# Patient Record
Sex: Female | Born: 1965 | Race: White | Hispanic: No | Marital: Single | Smoking: Never smoker
Health system: Southern US, Community
[De-identification: ages and names within clinical notes are randomized; demographics above are authoritative.]

## PROBLEM LIST (undated history)

## (undated) DIAGNOSIS — E039 Hypothyroidism, unspecified: Secondary | ICD-10-CM

## (undated) DIAGNOSIS — C50919 Malignant neoplasm of unspecified site of unspecified female breast: Secondary | ICD-10-CM

## (undated) HISTORY — PX: BREAST LUMPECTOMY: SHX2

## (undated) HISTORY — DX: Hypothyroidism, unspecified: E03.9

## (undated) HISTORY — DX: Malignant neoplasm of unspecified site of unspecified female breast: C50.919

---

## 2019-03-15 LAB — TSH: TSH: 2.94 (ref 0.41–5.90)

## 2019-08-15 ENCOUNTER — Ambulatory Visit: Payer: Federal, State, Local not specified - PPO | Admitting: "Endocrinology

## 2019-08-15 ENCOUNTER — Other Ambulatory Visit: Payer: Self-pay

## 2019-08-15 ENCOUNTER — Encounter: Payer: Self-pay | Admitting: "Endocrinology

## 2019-08-15 VITALS — BP 138/74 | HR 94 | Ht 63.0 in | Wt 271.0 lb

## 2019-08-15 DIAGNOSIS — E039 Hypothyroidism, unspecified: Secondary | ICD-10-CM

## 2019-08-15 NOTE — Progress Notes (Signed)
Endocrinology Consult Note                                         08/15/2019, 5:53 PM   Yvonne Frey is a 53 y.o.-year-old female patient being seen in consultation for hypothyroidism referred by Cathie Olden, MD.   Past Medical History:  Diagnosis Date  . Hypothyroidism     History reviewed. No pertinent surgical history.  Social History   Socioeconomic History  . Marital status: Single    Spouse name: Not on file  . Number of children: Not on file  . Years of education: Not on file  . Highest education level: Not on file  Occupational History  . Not on file  Social Needs  . Financial resource strain: Not on file  . Food insecurity    Worry: Not on file    Inability: Not on file  . Transportation needs    Medical: Not on file    Non-medical: Not on file  Tobacco Use  . Smoking status: Never Smoker  . Smokeless tobacco: Never Used  Substance and Sexual Activity  . Alcohol use: Never    Frequency: Never  . Drug use: Never  . Sexual activity: Not on file  Lifestyle  . Physical activity    Days per week: Not on file    Minutes per session: Not on file  . Stress: Not on file  Relationships  . Social Herbalist on phone: Not on file    Gets together: Not on file    Attends religious service: Not on file    Active member of club or organization: Not on file    Attends meetings of clubs or organizations: Not on file    Relationship status: Not on file  Other Topics Concern  . Not on file  Social History Narrative  . Not on file    History reviewed. No pertinent family history.  Outpatient Encounter Medications as of 08/15/2019  Medication Sig  . etanercept (ENBREL) 50 MG/ML injection Inject into the muscle once a week.  . levothyroxine (SYNTHROID) 25 MCG tablet Take by mouth daily.  Marland Kitchen lisinopril-hydrochlorothiazide (ZESTORETIC) 10-12.5 MG tablet Take by  mouth daily.  Marland Kitchen levothyroxine (SYNTHROID) 25 MCG tablet daily.  Marland Kitchen omeprazole (PRILOSEC) 40 MG capsule daily.  Marland Kitchen PREMARIN 0.45 MG tablet daily.   No facility-administered encounter medications on file as of 08/15/2019.     ALLERGIES: No Known Allergies VACCINATION STATUS:  There is no immunization history on file for this patient.   HPI    Yvonne Frey  is a patient with the above medical history. she was diagnosed  with hypothyroidism at approximate age of 76 years with work, which required initiation with levothyroxine replacement.  She is currently on her initial levothyroxine dose of 25 mcg p.o. daily before breakfast.  -She reports compliance with his medication.  She complains of progressive weight gain, fatigue, cold intolerance. She  does not have recent thyroid function tests, TSH was 2.94, with free t4   0.82 on Mar 15, 2019.  -She denies personal history of goiter, denies dysphagia, shortness of breath, but reports occasional voice change.     she reports family history of  thyroid disorders in her mother who is taking what appears to be thyroid hormone replacement.  No family history of thyroid cancer.  No history of  radiation therapy to head or neck. No recent use of iodine supplements.   ROS:  Constitutional: + weight gain, + fatigue, no subjective hyperthermia, no subjective hypothermia Eyes: no blurry vision, no xerophthalmia ENT: no sore throat, no nodules palpated in throat, no dysphagia/odynophagia, no hoarseness Cardiovascular: no Chest Pain, no Shortness of Breath, no palpitations, no leg swelling Respiratory: no cough, no SOB Gastrointestinal: no Nausea/Vomiting/Diarhhea Musculoskeletal: no muscle/joint aches Skin: no rashes Neurological: no tremors, no numbness, no tingling, no dizziness Psychiatric: no depression, no anxiety   Physical Exam: BP 138/74   Pulse 94   Ht 5\' 3"  (1.6 m)   Wt 271 lb (122.9 kg)   BMI 48.01 kg/m  Wt Readings from  Last 3 Encounters:  08/15/19 271 lb (122.9 kg)    Constitutional:  Body mass index is 48.01 kg/m., not in acute distress, normal state of mind Eyes: PERRLA, EOMI, no exophthalmos ENT: moist mucous membranes, + palpable thyromegaly, no cervical lymphadenopathy Cardiovascular: normal precordial activity, Regular Rate and Rhythm, no Murmur/Rubs/Gallops Respiratory:  adequate breathing efforts, no gross chest deformity, Clear to auscultation bilaterally Gastrointestinal: abdomen soft, Non -tender, No distension, Bowel Sounds present Musculoskeletal: no gross deformities, strength intact in all four extremities Skin: moist, warm, no rashes Neurological: no tremor with outstretched hands, Deep tendon reflexes normal in all four extremities.  Mar 15, 2019 thyroid function test: TSH 2.94, free T4 0.82  ASSESSMENT: 1. Hypothyroidism  PLAN:    Patient with long-standing hypothyroidism, on levothyroxine therapy. On physical exam , patient  does   have  gross goiter.  She is off her baseline thyroid ultrasound.  She may need higher dose of thyroid hormone.  She will be sent for new set of thyroid function test today. -In the meantime, he is advised to continue her current dose of levothyroxine at 25 mcg daily before breakfast. - We discussed about correct intake of levothyroxine, at fasting, with water, separated by at least 30 minutes from breakfast, and separated by more than 4 hours from calcium, iron, multivitamins, acid reflux medications (PPIs). -Patient is made aware of the fact that thyroid hormone replacement is needed for life, dose to be adjusted by periodic monitoring of thyroid function tests. - Time spent with the patient: 45 minutes, of which >50% was spent in obtaining information about her symptoms, reviewing her previous labs, evaluations, and treatments, counseling her about her   hypothyroidism, and developing a plan to confirm the diagnosis and long term treatment as necessary.  Please refer to " Patient Self Inventory" in the Media  tab for reviewed elements of pertinent patient history.  Mertie Clause participated in the discussions, expressed understanding, and voiced agreement with the above plans.  All questions were answered to her satisfaction. she is encouraged to contact clinic should she have any questions or concerns prior to her return visit.  Return in about 1 week (around 08/22/2019) for Labs Today- Non-Fasting Ok, Thyroid / Neck Ultrasound.  Glade Lloyd, MD [facility] [address] [place], [place] [address] Phone: [phone]  Fax: [phone]   08/15/2019, 5:53 PM  This note was partially dictated with voice recognition software. Similar sounding words can be transcribed inadequately or may not  be corrected upon review.

## 2019-08-16 ENCOUNTER — Other Ambulatory Visit: Payer: Self-pay

## 2019-08-16 ENCOUNTER — Ambulatory Visit (HOSPITAL_COMMUNITY)
Admission: RE | Admit: 2019-08-16 | Discharge: 2019-08-16 | Disposition: A | Discharge status: Home / Self Care | Payer: Federal, State, Local not specified - PPO | Source: Ambulatory Visit | Attending: "Endocrinology | Admitting: "Endocrinology

## 2019-08-16 DIAGNOSIS — E039 Hypothyroidism, unspecified: Secondary | ICD-10-CM | POA: Insufficient documentation

## 2019-08-16 LAB — T3, FREE: T3, Free: 3 pg/mL (ref 2.3–4.2)

## 2019-08-16 LAB — THYROGLOBULIN ANTIBODY: Thyroglobulin Ab: 4 IU/mL — ABNORMAL HIGH (ref <=1)

## 2019-08-16 LAB — THYROID PEROXIDASE ANTIBODY: Thyroperoxidase Ab SerPl-aCnc: 163 IU/mL — ABNORMAL HIGH (ref <9)

## 2019-08-16 LAB — T4, FREE: Free T4: 1 ng/dL (ref 0.8–1.8)

## 2019-08-16 LAB — TSH: TSH: 2.06 mIU/L

## 2019-08-22 ENCOUNTER — Encounter: Payer: Self-pay | Admitting: "Endocrinology

## 2019-08-22 ENCOUNTER — Ambulatory Visit (INDEPENDENT_AMBULATORY_CARE_PROVIDER_SITE_OTHER): Payer: Federal, State, Local not specified - PPO | Admitting: "Endocrinology

## 2019-08-22 ENCOUNTER — Other Ambulatory Visit: Payer: Self-pay

## 2019-08-22 DIAGNOSIS — E038 Other specified hypothyroidism: Secondary | ICD-10-CM | POA: Diagnosis not present

## 2019-08-22 DIAGNOSIS — E063 Autoimmune thyroiditis: Secondary | ICD-10-CM

## 2019-08-22 DIAGNOSIS — E049 Nontoxic goiter, unspecified: Secondary | ICD-10-CM

## 2019-08-22 DIAGNOSIS — E040 Nontoxic diffuse goiter: Secondary | ICD-10-CM

## 2019-08-22 MED ORDER — LEVOTHYROXINE SODIUM 50 MCG PO TABS: 50.0000 ug | ORAL_TABLET | Freq: Every day | ORAL | 3 refills | Status: DC

## 2019-08-22 NOTE — Progress Notes (Signed)
08/22/2019, 3:07 PM                                Endocrinology Telehealth Visit Follow up Note -During COVID -19 Pandemic  I connected with Yvonne Frey on 08/22/2019   by telephone and verified that I am speaking with the correct person using two identifiers. Yvonne Frey, 1966-03-14. she has verbally consented to this visit. All issues noted in this document were discussed and addressed. The format was not optimal for physical exam.   Yvonne Frey is a 53 y.o.-year-old female patient being engaged in telehealth via telephone in follow-up after she was seen in consultation for hypothyroidism referred by Cathie Olden, MD.   Past Medical History:  Diagnosis Date  . Hypothyroidism     History reviewed. No pertinent surgical history.  Social History   Socioeconomic History  . Marital status: Single    Spouse name: Not on file  . Number of children: Not on file  . Years of education: Not on file  . Highest education level: Not on file  Occupational History  . Not on file  Social Needs  . Financial resource strain: Not on file  . Food insecurity    Worry: Not on file    Inability: Not on file  . Transportation needs    Medical: Not on file    Non-medical: Not on file  Tobacco Use  . Smoking status: Never Smoker  . Smokeless tobacco: Never Used  Substance and Sexual Activity  . Alcohol use: Never    Frequency: Never  . Drug use: Never  . Sexual activity: Not on file  Lifestyle  . Physical activity    Days per week: Not on file    Minutes per session: Not on file  . Stress: Not on file  Relationships  . Social Herbalist on phone: Not on file    Gets together: Not on file    Attends religious service: Not on file    Active member of club or organization: Not on file    Attends meetings of clubs or organizations: Not on file    Relationship status: Not on file  Other Topics Concern  . Not on file   Social History Narrative  . Not on file    History reviewed. No pertinent family history.  Outpatient Encounter Medications as of 08/22/2019  Medication Sig  . etanercept (ENBREL) 50 MG/ML injection Inject into the muscle once a week.  . levothyroxine (SYNTHROID) 50 MCG tablet Take 1 tablet (50 mcg total) by mouth daily before breakfast.  . lisinopril-hydrochlorothiazide (ZESTORETIC) 10-12.5 MG tablet Take by mouth daily.  Marland Kitchen omeprazole (PRILOSEC) 40 MG capsule daily.  Marland Kitchen PREMARIN 0.45 MG tablet daily.  . [DISCONTINUED] levothyroxine (SYNTHROID) 25 MCG tablet daily.  . [DISCONTINUED] levothyroxine (SYNTHROID) 25 MCG tablet Take by mouth daily.   No facility-administered encounter medications on file as of 08/22/2019.     ALLERGIES: No Known Allergies VACCINATION STATUS:  There is no immunization history on file for this patient.   HPI    Yvonne Frey  is a patient with the above medical history. she was diagnosed  with hypothyroidism at approximate age of  55 years with work, which required initiation with levothyroxine replacement.  She is currently on her initial levothyroxine dose of 25 mcg p.o. daily before breakfast.  -She reports compliance with medication.  Her previsit labs show evidence of under replacement. She complains of progressive weight gain, fatigue, cold intolerance.  -She denies personal history of goiter, denies dysphagia, shortness of breath, but reports occasional voice change.     she reports family history of  thyroid disorders in her mother who is taking what appears to be thyroid hormone replacement.  No family history of thyroid cancer.  No history of  radiation therapy to head or neck. No recent use of iodine supplements.   ROS: Limited as above.   Physical Exam: There were no vitals taken for this visit. Wt Readings from Last 3 Encounters:  08/15/19 271 lb (122.9 kg)   Recent Results (from the past 2160 hour(s))  TSH     Status: None    Collection Time: 08/15/19 12:18 PM  Result Value Ref Range   TSH 2.06 mIU/L    Comment:           Reference Range .           > or = 20 Years  0.40-4.50 .                Pregnancy Ranges           First trimester    0.26-2.66           Second trimester   0.55-2.73           Third trimester    0.43-2.91   T4, free     Status: None   Collection Time: 08/15/19 12:18 PM  Result Value Ref Range   Free T4 1.0 0.8 - 1.8 ng/dL  T3, free     Status: None   Collection Time: 08/15/19 12:18 PM  Result Value Ref Range   T3, Free 3.0 2.3 - 4.2 pg/mL  Thyroid peroxidase antibody     Status: Abnormal   Collection Time: 08/15/19 12:18 PM  Result Value Ref Range   Thyroperoxidase Ab SerPl-aCnc 163 (H) <9 IU/mL  Thyroglobulin antibody     Status: Abnormal   Collection Time: 08/15/19 12:18 PM  Result Value Ref Range   Thyroglobulin Ab 4 (H) < or = 1 IU/mL    Thyroid ultrasound from August 16, 2019 No discrete nodules are seen within the thyroid gland.  IMPRESSION: Borderline enlarged and moderately heterogeneous appearing thyroid gland without discrete nodule or mass.   Mar 15, 2019 thyroid function test: TSH 2.94, free T4 0.82  ASSESSMENT: 1. 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  Simple goiter  PLAN:   -Her previsit thyroid function tests are consistent with under replacement.  She would benefit from higher dose of levothyroxine.  I discussed and increase her levothyroxine to 50 mcg p.o. daily before breakfast.   - We discussed about the correct intake of her thyroid hormone, on empty stomach at fasting, with water, separated by at least 30 minutes from breakfast and other medications,  and separated by more than 4 hours from calcium, iron, multivitamins, acid reflux medications (PPIs). -Patient is made aware of the fact that thyroid hormone replacement is needed for life, dose to be adjusted by periodic monitoring of thyroid function tests. -Her thyroid ultrasound is  unremarkable, will not need any intervention.  She may need surveillance thyroid ultrasound in 2 to 3 years.   Time for this visit:  15 minutes. Yvonne Frey  participated in the discussions, expressed understanding, and voiced agreement with the above plans.  All questions were answered to her satisfaction. she is encouraged to contact clinic should she have any questions or concerns prior to her return visit.   Return in about 3 months (around 11/22/2019) for Follow up with Pre-visit Labs.  Glade Lloyd, MD [facility] [address] [place], [place] [address] Phone: [phone]  Fax: [phone]   08/22/2019, 3:07 PM  This note was partially dictated with voice recognition software. Similar sounding words can be transcribed inadequately or may not  be corrected upon review.

## 2019-11-21 ENCOUNTER — Other Ambulatory Visit: Payer: Self-pay | Admitting: "Endocrinology

## 2019-11-23 ENCOUNTER — Ambulatory Visit: Payer: Federal, State, Local not specified - PPO | Admitting: "Endocrinology

## 2019-12-22 LAB — TSH: TSH: 0.85 mIU/L

## 2019-12-22 LAB — T4, FREE: Free T4: 1.2 ng/dL (ref 0.8–1.8)

## 2019-12-26 ENCOUNTER — Encounter: Payer: Self-pay | Admitting: "Endocrinology

## 2019-12-26 ENCOUNTER — Ambulatory Visit (INDEPENDENT_AMBULATORY_CARE_PROVIDER_SITE_OTHER): Payer: Federal, State, Local not specified - PPO | Admitting: "Endocrinology

## 2019-12-26 DIAGNOSIS — E038 Other specified hypothyroidism: Secondary | ICD-10-CM

## 2019-12-26 DIAGNOSIS — E063 Autoimmune thyroiditis: Secondary | ICD-10-CM | POA: Diagnosis not present

## 2019-12-26 MED ORDER — LEVOTHYROXINE SODIUM 50 MCG PO TABS: ORAL_TABLET | ORAL | 1 refills | Status: DC

## 2019-12-26 NOTE — Progress Notes (Signed)
12/26/2019, 5:20 PM                                Endocrinology Telehealth Visit Follow up Note -During COVID -19 Pandemic  I connected with Yvonne Frey on 12/26/2019   by telephone and verified that I am speaking with the correct person using two identifiers. Yvonne Frey, Dec 22, 1965. she has verbally consented to this visit. All issues noted in this document were discussed and addressed. The format was not optimal for physical exam.   Yvonne Frey is a 54 y.o.-year-old female patient being engaged in telehealth via telephone in follow-up after she was seen in consultation for hypothyroidism referred by Cathie Olden, MD.   Past Medical History:  Diagnosis Date  . Hypothyroidism     History reviewed. No pertinent surgical history.  Social History   Socioeconomic History  . Marital status: Single    Spouse name: Not on file  . Number of children: Not on file  . Years of education: Not on file  . Highest education level: Not on file  Occupational History  . Not on file  Tobacco Use  . Smoking status: Never Smoker  . Smokeless tobacco: Never Used  Substance and Sexual Activity  . Alcohol use: Never  . Drug use: Never  . Sexual activity: Not on file  Other Topics Concern  . Not on file  Social History Narrative  . Not on file   Social Determinants of Health   Financial Resource Strain:   . Difficulty of Paying Living Expenses: Not on file  Food Insecurity:   . Worried About Charity fundraiser in the Last Year: Not on file  . Ran Out of Food in the Last Year: Not on file  Transportation Needs:   . Lack of Transportation (Medical): Not on file  . Lack of Transportation (Non-Medical): Not on file  Physical Activity:   . Days of Exercise per Week: Not on file  . Minutes of Exercise per Session: Not on file  Stress:   . Feeling of Stress : Not on file  Social Connections:   . Frequency of Communication with  Friends and Family: Not on file  . Frequency of Social Gatherings with Friends and Family: Not on file  . Attends Religious Services: Not on file  . Active Member of Clubs or Organizations: Not on file  . Attends Archivist Meetings: Not on file  . Marital Status: Not on file    History reviewed. No pertinent family history.  Outpatient Encounter Medications as of 12/26/2019  Medication Sig  . etanercept (ENBREL) 50 MG/ML injection Inject into the muscle once a week.  . levothyroxine (SYNTHROID) 50 MCG tablet TAKE 1 TABLET BY MOUTH DAILY BEFORE BREAKFAST  . lisinopril-hydrochlorothiazide (ZESTORETIC) 10-12.5 MG tablet Take by mouth daily.  Marland Kitchen omeprazole (PRILOSEC) 40 MG capsule daily.  Marland Kitchen PREMARIN 0.45 MG tablet daily.  . [DISCONTINUED] levothyroxine (SYNTHROID) 50 MCG tablet TAKE 1 TABLET BY MOUTH DAILY BEFORE BREAKFAST   No facility-administered encounter medications on file as of 12/26/2019.    ALLERGIES: No Known Allergies VACCINATION STATUS:  There is no immunization history on file for this patient.   HPI  Yvonne Frey  is a patient with the above medical history. she was diagnosed  with hypothyroidism at approximate age of 59 years with work, which required initiation with levothyroxine replacement.  She is currently on her levothyroxine 50 mcg p.o. daily before breakfast.  She reports compliance to her medication.   -She reports compliance with medication.  Her previsit labs are consistent with appropriate replacement.  Has no new complaints today.    -She denies personal history of goiter, denies dysphagia, shortness of breath, but reports occasional voice change.     she reports family history of  thyroid disorders in her mother who is taking what appears to be thyroid hormone replacement.  No family history of thyroid cancer.  No history of  radiation therapy to head or neck. No recent use of iodine supplements.   ROS: Limited as above.   Physical  Exam: There were no vitals taken for this visit. Wt Readings from Last 3 Encounters:  08/15/19 271 lb (122.9 kg)   Recent Results (from the past 2160 hour(s))  TSH     Status: None   Collection Time: 12/21/19  1:17 PM  Result Value Ref Range   TSH 0.85 mIU/L    Comment:           Reference Range .           > or = 20 Years  0.40-4.50 .                Pregnancy Ranges           First trimester    0.26-2.66           Second trimester   0.55-2.73           Third trimester    0.43-2.91   T4, free     Status: None   Collection Time: 12/21/19  1:17 PM  Result Value Ref Range   Free T4 1.2 0.8 - 1.8 ng/dL    Thyroid ultrasound from August 16, 2019 No discrete nodules are seen within the thyroid gland.  IMPRESSION: Borderline enlarged and moderately heterogeneous appearing thyroid gland without discrete nodule or mass.   Mar 15, 2019 thyroid function test: TSH 2.94, free T4 0.82  ASSESSMENT: 1. 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  Simple goiter  PLAN:   -Her previsit thyroid function tests are consistent with appropriate replacement.  She is advised to continue  levothyroxine  50 mcg p.o. daily before breakfast.   - We discussed about the correct intake of her thyroid hormone, on empty stomach at fasting, with water, separated by at least 30 minutes from breakfast and other medications,  and separated by more than 4 hours from calcium, iron, multivitamins, acid reflux medications (PPIs). -Patient is made aware of the fact that thyroid hormone replacement is needed for life, dose to be adjusted by periodic monitoring of thyroid function tests.  -Her thyroid ultrasound is unremarkable, will not need any intervention.  She may need surveillance thyroid ultrasound in 2 to 3 years.      - Time spent on this patient care encounter:  20 minutes of which 50% was spent in  counseling and the rest reviewing  her current and  previous labs / studies and medications   doses and developing a plan for long term care. Yvonne Frey  participated in the discussions, expressed understanding, and voiced agreement with the above plans.  All questions were answered to her satisfaction. she is encouraged to contact clinic  should she have any questions or concerns prior to her return visit.    Return in about 6 months (around 06/27/2020) for Follow up with Pre-visit Labs.  Glade Lloyd, MD [facility] [address] [place], [place] [address] Phone: [phone]  Fax: [phone]   12/26/2019, 5:20 PM  This note was partially dictated with voice recognition software. Similar sounding words can be transcribed inadequately or may not  be corrected upon review.

## 2020-05-10 LAB — TSH: TSH: 1.36 (ref 0.41–5.90)

## 2020-06-28 ENCOUNTER — Ambulatory Visit: Payer: Federal, State, Local not specified - PPO | Admitting: "Endocrinology

## 2020-07-09 ENCOUNTER — Other Ambulatory Visit: Payer: Self-pay

## 2020-07-09 DIAGNOSIS — E038 Other specified hypothyroidism: Secondary | ICD-10-CM

## 2020-07-09 DIAGNOSIS — E063 Autoimmune thyroiditis: Secondary | ICD-10-CM

## 2020-07-09 LAB — T4, FREE: Free T4: 1 ng/dL (ref 0.8–1.8)

## 2020-07-09 LAB — TSH: TSH: 2.84 mIU/L

## 2020-07-10 ENCOUNTER — Telehealth (INDEPENDENT_AMBULATORY_CARE_PROVIDER_SITE_OTHER): Payer: Federal, State, Local not specified - PPO | Admitting: "Endocrinology

## 2020-07-10 ENCOUNTER — Encounter: Payer: Self-pay | Admitting: "Endocrinology

## 2020-07-10 VITALS — BP 127/74 | Ht 63.0 in | Wt 263.0 lb

## 2020-07-10 DIAGNOSIS — E038 Other specified hypothyroidism: Secondary | ICD-10-CM | POA: Diagnosis not present

## 2020-07-10 DIAGNOSIS — E063 Autoimmune thyroiditis: Secondary | ICD-10-CM | POA: Diagnosis not present

## 2020-07-10 MED ORDER — LEVOTHYROXINE SODIUM 50 MCG PO TABS
ORAL_TABLET | ORAL | 1 refills | Status: DC
Start: 2020-07-10 — End: 2020-08-27

## 2020-07-10 NOTE — Progress Notes (Signed)
07/10/2020, 5:17 PM                                        Endocrinology Telehealth Visit Follow up Note -During COVID -19 Pandemic  This visit type was conducted  via telephonedue to national recommendations for restrictions regarding the COVID-19 Pandemic  in an effort to limit this patient's exposure and mitigate transmission of the corona virus.   I connected with Yvonne Frey on 07/10/2020   by telephone and verified that I am speaking with the correct person using two identifiers. Yvonne Frey, 1966-08-11. she has verbally consented to this visit.  I was in my office and patient was in her residence. No other persons were with me during the encounter. All issues noted in this document were discussed and addressed. The format was not optimal for physical exam.   Yvonne Frey is a 54 y.o.-year-old female patient being engaged in telehealth via telephone in follow-up after she was seen in consultation for hypothyroidism referred by Cathie Olden, MD.   Past Medical History:  Diagnosis Date  . Hypothyroidism     History reviewed. No pertinent surgical history.  Social History   Socioeconomic History  . Marital status: Single    Spouse name: Not on file  . Number of children: Not on file  . Years of education: Not on file  . Highest education level: Not on file  Occupational History  . Not on file  Tobacco Use  . Smoking status: Never Smoker  . Smokeless tobacco: Never Used  Substance and Sexual Activity  . Alcohol use: Never  . Drug use: Never  . Sexual activity: Not on file  Other Topics Concern  . Not on file  Social History Narrative  . Not on file   Social Determinants of Health   Financial Resource Strain:   . Difficulty of Paying Living Expenses: Not on file  Food Insecurity:   . Worried About Charity fundraiser in the Last Year: Not on file  . Ran Out of Food in the Last Year: Not on file   Transportation Needs:   . Lack of Transportation (Medical): Not on file  . Lack of Transportation (Non-Medical): Not on file  Physical Activity:   . Days of Exercise per Week: Not on file  . Minutes of Exercise per Session: Not on file  Stress:   . Feeling of Stress : Not on file  Social Connections:   . Frequency of Communication with Friends and Family: Not on file  . Frequency of Social Gatherings with Friends and Family: Not on file  . Attends Religious Services: Not on file  . Active Member of Clubs or Organizations: Not on file  . Attends Archivist Meetings: Not on file  . Marital Status: Not on file    History reviewed. No pertinent family history.  Outpatient Encounter Medications as of 07/10/2020  Medication Sig  . levothyroxine (SYNTHROID) 50 MCG tablet TAKE 1 TABLET BY MOUTH DAILY BEFORE BREAKFAST  . lisinopril-hydrochlorothiazide (ZESTORETIC) 10-12.5 MG tablet Take by mouth daily.  Marland Kitchen omeprazole (PRILOSEC) 40 MG capsule daily.  Marland Kitchen PREMARIN 0.45 MG tablet daily.  Marland Kitchen  ustekinumab (STELARA) 90 MG/ML SOSY injection Inject into the skin.  . [DISCONTINUED] etanercept (ENBREL) 50 MG/ML injection Inject into the muscle once a week.  . [DISCONTINUED] levothyroxine (SYNTHROID) 50 MCG tablet TAKE 1 TABLET BY MOUTH DAILY BEFORE BREAKFAST   No facility-administered encounter medications on file as of 07/10/2020.    ALLERGIES: No Known Allergies VACCINATION STATUS:  There is no immunization history on file for this patient.   HPI    Yvonne Frey  is a patient with the above medical history. she was diagnosed  with hypothyroidism at approximate age of 62 years with work, which required initiation with levothyroxine replacement.  She is currently on levothyroxine 50 mcg p.o. daily before breakfast.  She reports compliance to her medication.  She has no new complaints today.    -She denies personal history of goiter, denies dysphagia, shortness of breath, but reports  occasional voice change.     she reports family history of  thyroid disorders in her mother who is taking what appears to be thyroid hormone replacement.  No family history of thyroid cancer.  No history of  radiation therapy to head or neck. No recent use of iodine supplements.   ROS: Limited as above.   Physical Exam: BP 127/74   Ht 5\' 3"  (1.6 m)   Wt 263 lb (119.3 kg)   BMI 46.59 kg/m  Wt Readings from Last 3 Encounters:  07/10/20 263 lb (119.3 kg)  08/15/19 271 lb (122.9 kg)   Recent Results (from the past 2160 hour(s))  TSH     Status: None   Collection Time: 05/10/20 12:00 AM  Result Value Ref Range   TSH 1.36 0.41 - 5.90    Comment: Free T4- 0.80  TSH     Status: None   Collection Time: 07/09/20 12:00 AM  Result Value Ref Range   TSH 2.84 mIU/L    Comment:           Reference Range .           > or = 20 Years  0.40-4.50 .                Pregnancy Ranges           First trimester    0.26-2.66           Second trimester   0.55-2.73           Third trimester    0.43-2.91   T4, Free     Status: None   Collection Time: 07/09/20 12:00 AM  Result Value Ref Range   Free T4 1.0 0.8 - 1.8 ng/dL    Thyroid ultrasound from August 16, 2019 No discrete nodules are seen within the thyroid gland.  IMPRESSION: Borderline enlarged and moderately heterogeneous appearing thyroid gland without discrete nodule or mass.   Mar 15, 2019 thyroid function test: TSH 2.94, free T4 0.82  ASSESSMENT: 1. 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  Simple goiter  PLAN:   -Her previsit thyroid function tests are consistent with appropriate replacement.  She is advised to continue levothyroxine 50 mcg p.o. daily before breakfast.    - We discussed about the correct intake of her thyroid hormone, on empty stomach at fasting, with water, separated by at least 30 minutes from breakfast and other medications,  and separated by more than 4 hours from calcium, iron,  multivitamins, acid reflux medications (PPIs). -Patient is made aware of the fact that thyroid hormone replacement is needed for life,  dose to be adjusted by periodic monitoring of thyroid function tests.  -Her thyroid ultrasound is unremarkable, will not need any intervention.  She may need surveillance thyroid ultrasound in 2 .  She is advised to maintain close follow-up with her PMD.     - Time spent on this patient care encounter:  20 minutes of which 50% was spent in  counseling and the rest reviewing  her current and  previous labs / studies and medications  doses and developing a plan for long term care. Yvonne Frey  participated in the discussions, expressed understanding, and voiced agreement with the above plans.  All questions were answered to her satisfaction. she is encouraged to contact clinic should she have any questions or concerns prior to her return visit.   Return in about 6 months (around 01/07/2021) for F/U with Pre-visit Labs.  Glade Lloyd, MD [facility] [address] [place], [place] [address] Phone: [phone]  Fax: [phone]   07/10/2020, 5:17 PM  This note was partially dictated with voice recognition software. Similar sounding words can be transcribed inadequately or may not  be corrected upon review.

## 2020-08-26 ENCOUNTER — Other Ambulatory Visit: Payer: Self-pay | Admitting: "Endocrinology

## 2021-01-09 ENCOUNTER — Ambulatory Visit: Payer: Federal, State, Local not specified - PPO | Admitting: "Endocrinology

## 2021-02-05 LAB — TSH: TSH: 1.25 u[IU]/mL (ref 0.450–4.500)

## 2021-02-05 LAB — T4, FREE: Free T4: 0.96 ng/dL (ref 0.82–1.77)

## 2021-02-07 ENCOUNTER — Ambulatory Visit: Payer: Federal, State, Local not specified - PPO | Admitting: "Endocrinology

## 2021-02-07 ENCOUNTER — Encounter: Payer: Self-pay | Admitting: "Endocrinology

## 2021-02-07 ENCOUNTER — Other Ambulatory Visit: Payer: Self-pay

## 2021-02-07 VITALS — BP 122/62 | HR 68 | Ht 63.0 in | Wt 271.4 lb

## 2021-02-07 DIAGNOSIS — E063 Autoimmune thyroiditis: Secondary | ICD-10-CM | POA: Diagnosis not present

## 2021-02-07 DIAGNOSIS — E040 Nontoxic diffuse goiter: Secondary | ICD-10-CM

## 2021-02-07 DIAGNOSIS — E049 Nontoxic goiter, unspecified: Secondary | ICD-10-CM

## 2021-02-07 DIAGNOSIS — E038 Other specified hypothyroidism: Secondary | ICD-10-CM | POA: Diagnosis not present

## 2021-02-07 MED ORDER — LEVOTHYROXINE SODIUM 75 MCG PO TABS: ORAL_TABLET | ORAL | 1 refills | Status: DC

## 2021-02-07 NOTE — Progress Notes (Signed)
02/07/2021, 1:20 PM                            Endocrinology follow-up note  Yvonne Frey is a 55 y.o.-year-old female patient being engaged in telehealth via telephone in follow-up after she was seen in consultation for hypothyroidism referred by Cathie Olden, MD.   Past Medical History:  Diagnosis Date  . Hypothyroidism     History reviewed. No pertinent surgical history.  Social History   Socioeconomic History  . Marital status: Single    Spouse name: Not on file  . Number of children: Not on file  . Years of education: Not on file  . Highest education level: Not on file  Occupational History  . Not on file  Tobacco Use  . Smoking status: Never Smoker  . Smokeless tobacco: Never Used  Substance and Sexual Activity  . Alcohol use: Never  . Drug use: Never  . Sexual activity: Not on file  Other Topics Concern  . Not on file  Social History Narrative  . Not on file   Social Determinants of Health   Financial Resource Strain: Not on file  Food Insecurity: Not on file  Transportation Needs: Not on file  Physical Activity: Not on file  Stress: Not on file  Social Connections: Not on file    History reviewed. No pertinent family history.  Outpatient Encounter Medications as of 02/07/2021  Medication Sig  . levothyroxine (SYNTHROID) 75 MCG tablet TAKE 1 TABLET BY MOUTH EVERY DAY BEFORE BREAKFAST  . lisinopril-hydrochlorothiazide (ZESTORETIC) 10-12.5 MG tablet Take by mouth daily.  Marland Kitchen omeprazole (PRILOSEC) 40 MG capsule daily.  Marland Kitchen PREMARIN 0.45 MG tablet daily.  . ustekinumab (STELARA) 90 MG/ML SOSY injection Inject into the skin. (Patient not taking: Reported on 02/07/2021)  . [DISCONTINUED] levothyroxine (SYNTHROID) 50 MCG tablet TAKE 1 TABLET BY MOUTH EVERY DAY BEFORE BREAKFAST   No facility-administered encounter medications on file as of 02/07/2021.    ALLERGIES: Allergies  Allergen Reactions  . Sulfa  Antibiotics Palpitations   VACCINATION STATUS:  There is no immunization history on file for this patient.   HPI    Yvonne Frey  is a patient with the above medical history. she was diagnosed with hypothyroidism at approximate age of 26 years which required initiation of thyroid hormone replacement.  She is currently on levothyroxine 50 mcg p.o. daily before breakfast.  She continues to tolerate and benefit from this medication.    She has no new complaints today.  Etiology of her hypothyroidism was determined to be Hashimoto's thyroiditis.    -She denies personal history of goiter, denies dysphagia, shortness of breath, but reports occasional voice change.     she reports family history of  thyroid disorders in her mother who is taking what appears to be thyroid hormone replacement.  No family history of thyroid cancer.  No history of  radiation therapy to head or neck. No recent use of iodine supplements.   ROS: Limited as above.   Physical Exam: BP 122/62   Pulse 68   Ht 5\' 3"  (1.6 m)   Wt 271 lb 6.4 oz (123.1 kg)   BMI 48.08 kg/m  Wt Readings from Last 3 Encounters:  02/07/21 271 lb 6.4 oz (123.1 kg)  07/10/20 263 lb (119.3 kg)  08/15/19 271 lb (122.9 kg)   Recent Results (from the past 2160 hour(s))  TSH     Status: None   Collection Time: 02/04/21  1:06 PM  Result Value Ref Range   TSH 1.250 0.450 - 4.500 uIU/mL  T4, free     Status: None   Collection Time: 02/04/21  1:06 PM  Result Value Ref Range   Free T4 0.96 0.82 - 1.77 ng/dL    Thyroid ultrasound from August 16, 2019 No discrete nodules are seen within the thyroid gland.  IMPRESSION: Borderline enlarged and moderately heterogeneous appearing thyroid gland without discrete nodule or mass.   Mar 15, 2019 thyroid function test: TSH 2.94, free T4 0.82  ASSESSMENT: 1. 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  Simple goiter  PLAN:   -Her previsit thyroid function tests are such that  she would benefit from slight increase in her levothyroxine.  I discussed and increase her levothyroxine to 75 mcg p.o. daily before breakfast.   - We discussed about the correct intake of her thyroid hormone, on empty stomach at fasting, with water, separated by at least 30 minutes from breakfast and other medications,  and separated by more than 4 hours from calcium, iron, multivitamins, acid reflux medications (PPIs). -Patient is made aware of the fact that thyroid hormone replacement is needed for life, dose to be adjusted by periodic monitoring of thyroid function tests.   -Her thyroid ultrasound is consistent with simple moderately heterogeneous appearing thyroid gland without discrete nodules or mass.  She will not need antithyroid intervention at this time.  She may need surveillance thyroid ultrasound after her next visit.  .  She is advised to maintain close follow-up with her PMD.    I spent 25 minutes in the care of the patient today including review of labs from Thyroid Function, CMP, and other relevant labs ; imaging/biopsy records (current and previous including abstractions from other facilities); face-to-face time discussing  her lab results and symptoms, medications doses, her options of short and long term treatment based on the latest standards of care / guidelines;   and documenting the encounter.  Mertie Clause  participated in the discussions, expressed understanding, and voiced agreement with the above plans.  All questions were answered to her satisfaction. she is encouraged to contact clinic should she have any questions or concerns prior to her return visit.    Return in about 6 months (around 08/09/2021) for F/U with Pre-visit Labs.  Glade Lloyd, MD [facility] [address] [place], [place] [address] Phone: [phone]  Fax: [phone]   02/07/2021, 1:20 PM  This note was partially dictated with voice  recognition software. Similar sounding words can be transcribed inadequately or may not  be corrected upon review.

## 2021-02-20 ENCOUNTER — Other Ambulatory Visit: Payer: Self-pay | Admitting: "Endocrinology

## 2021-06-03 ENCOUNTER — Other Ambulatory Visit: Payer: Self-pay | Admitting: "Endocrinology

## 2021-06-04 MED ORDER — LEVOTHYROXINE SODIUM 75 MCG PO TABS: 75.0000 ug | ORAL_TABLET | Freq: Every day | ORAL | 0 refills | Status: DC

## 2021-08-03 LAB — TSH: TSH: 0.803 u[IU]/mL (ref 0.450–4.500)

## 2021-08-03 LAB — T4, FREE: Free T4: 1.18 ng/dL (ref 0.82–1.77)

## 2021-08-09 ENCOUNTER — Ambulatory Visit: Payer: Federal, State, Local not specified - PPO | Admitting: "Endocrinology

## 2021-08-09 ENCOUNTER — Encounter: Payer: Self-pay | Admitting: "Endocrinology

## 2021-08-09 ENCOUNTER — Other Ambulatory Visit: Payer: Self-pay

## 2021-08-09 VITALS — BP 145/65 | HR 58 | Ht 63.0 in | Wt 249.0 lb

## 2021-08-09 DIAGNOSIS — E063 Autoimmune thyroiditis: Secondary | ICD-10-CM | POA: Diagnosis not present

## 2021-08-09 DIAGNOSIS — E038 Other specified hypothyroidism: Secondary | ICD-10-CM

## 2021-08-09 DIAGNOSIS — E040 Nontoxic diffuse goiter: Secondary | ICD-10-CM | POA: Diagnosis not present

## 2021-08-09 MED ORDER — LEVOTHYROXINE SODIUM 75 MCG PO TABS: 75.0000 ug | ORAL_TABLET | Freq: Every day | ORAL | 1 refills | Status: DC

## 2021-08-09 NOTE — Progress Notes (Signed)
08/09/2021, 4:25 PM                            Endocrinology follow-up note  Yvonne Frey is a 55 y.o.-year-old female patient being seen in follow-up  for hypothyroidism referred by Yvonne Olden, MD.   Past Medical History:  Diagnosis Date   Hypothyroidism     History reviewed. No pertinent surgical history.  Social History   Socioeconomic History   Marital status: Single    Spouse name: Not on file   Number of children: Not on file   Years of education: Not on file   Highest education level: Not on file  Occupational History   Not on file  Tobacco Use   Smoking status: Never   Smokeless tobacco: Never  Substance and Sexual Activity   Alcohol use: Never   Drug use: Never   Sexual activity: Not on file  Other Topics Concern   Not on file  Social History Narrative   Not on file   Social Determinants of Health   Financial Resource Strain: Not on file  Food Insecurity: Not on file  Transportation Needs: Not on file  Physical Activity: Not on file  Stress: Not on file  Social Connections: Not on file    History reviewed. No pertinent family history.  Outpatient Encounter Medications as of 08/09/2021  Medication Sig   levothyroxine (SYNTHROID) 75 MCG tablet Take 1 tablet (75 mcg total) by mouth daily before breakfast.   lisinopril-hydrochlorothiazide (ZESTORETIC) 10-12.5 MG tablet Take by mouth daily.   omeprazole (PRILOSEC) 40 MG capsule daily. (Patient not taking: Reported on 08/09/2021)   PREMARIN 0.45 MG tablet daily.   [DISCONTINUED] levothyroxine (SYNTHROID) 75 MCG tablet Take 1 tablet (75 mcg total) by mouth daily before breakfast.   [DISCONTINUED] ustekinumab (STELARA) 90 MG/ML SOSY injection Inject into the skin. (Patient not taking: Reported on 02/07/2021)   No facility-administered encounter medications on file as of 08/09/2021.    ALLERGIES: Allergies  Allergen Reactions   Sulfa Antibiotics  Palpitations   VACCINATION STATUS:  There is no immunization history on file for this patient.   HPI    Yvonne Frey  is a patient with the above medical history. she was diagnosed with hypothyroidism at approximate age of 72 years which required initiation of thyroid hormone replacement.  She is currently on levothyroxine 75 mcg p.o. daily before breakfast.  She continues to tolerate and benefit from this medication.    She has no new complaints today.  Etiology of her hypothyroidism was determined to be Hashimoto's thyroiditis.    -She denies personal history of goiter, denies dysphagia, shortness of breath, but reports occasional voice change.  She continues to lose weight up to 20 pounds since last visit.  She explains most of his weight loss is intentional.   she reports family history of  thyroid disorders in her mother who is taking what appears to be thyroid hormone replacement.  No family history of thyroid cancer.  No history of  radiation therapy to head or neck. No recent use of iodine supplements.   ROS: Limited as above.   Physical Exam: BP (!) 145/65   Pulse (!) 58   Ht 5\' 3"  (1.6  m)   Wt 249 lb (112.9 kg)   BMI 44.11 kg/m  Wt Readings from Last 3 Encounters:  08/09/21 249 lb (112.9 kg)  02/07/21 271 lb 6.4 oz (123.1 kg)  07/10/20 263 lb (119.3 kg)   Recent Results (from the past 2160 hour(s))  TSH     Status: None   Collection Time: 08/02/21 10:17 AM  Result Value Ref Range   TSH 0.803 0.450 - 4.500 uIU/mL  T4, free     Status: None   Collection Time: 08/02/21 10:17 AM  Result Value Ref Range   Free T4 1.18 0.82 - 1.77 ng/dL    Thyroid ultrasound from August 16, 2019 No discrete nodules are seen within the thyroid gland.   IMPRESSION: Borderline enlarged and moderately heterogeneous appearing thyroid gland without discrete nodule or mass.    Mar 15, 2019 thyroid function test: TSH 2.94, free T4 0.82  ASSESSMENT: 1. Hypothyroidism-due to  Hashimoto's thyroiditis 2.  Simple goiter  PLAN:   -Her previsit thyroid function tests are consistent with appropriate replacement.  I discussed and continue levothyroxine 75 mcg p.o. daily before breakfast.    - We discussed about the correct intake of her thyroid hormone, on empty stomach at fasting, with water, separated by at least 30 minutes from breakfast and other medications,  and separated by more than 4 hours from calcium, iron, multivitamins, acid reflux medications (PPIs). -Patient is made aware of the fact that thyroid hormone replacement is needed for life, dose to be adjusted by periodic monitoring of thyroid function tests.    -Her thyroid ultrasound is consistent with simple moderately heterogeneous appearing thyroid gland without discrete nodules or mass.  She will not need antithyroid intervention at this time.  She may need surveillance thyroid ultrasound after her next visit.  .  She is advised to maintain close follow-up with her PMD.   I spent 21 minutes in the care of the patient today including review of labs from Thyroid Function, CMP, and other relevant labs ; imaging/biopsy records (current and previous including abstractions from other facilities); face-to-face time discussing  her lab results and symptoms, medications doses, her options of short and long term treatment based on the latest standards of care / guidelines;   and documenting the encounter.  Mertie Clause  participated in the discussions, expressed understanding, and voiced agreement with the above plans.  All questions were answered to her satisfaction. she is encouraged to contact clinic should she have any questions or concerns prior to her return visit.   Return in about 6 months (around 02/07/2022) for F/U with Pre-visit Labs.  Glade Lloyd, MD [facility] [address] [place], [place] [address] Phone: [phone]  Fax: [phone]    08/09/2021, 4:25 PM  This note was partially dictated with voice recognition software. Similar sounding words can be transcribed inadequately or may not  be corrected upon review.

## 2021-09-01 ENCOUNTER — Other Ambulatory Visit: Payer: Self-pay | Admitting: "Endocrinology

## 2021-09-15 IMAGING — US US THYROID
1 series · 14 of 25 positions shown · non-contrast
Comparison: None.

CLINICAL DATA: Hypothyroid.

EXAM:
THYROID ULTRASOUND
TECHNIQUE: Ultrasound examination of the thyroid gland and adjacent soft
tissues was performed.

[Series 1: us thyroid · 0.06mm/px · 14 of 76 slices shown]
[im 1/76]
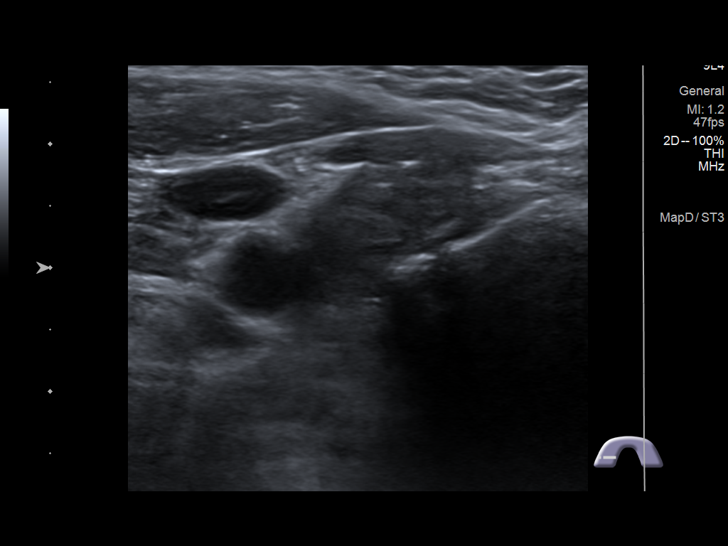
[im 7/76]
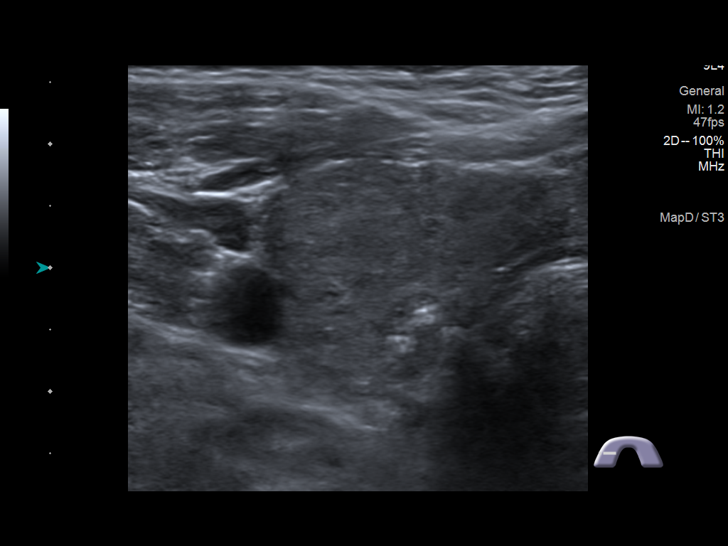
[im 13/76]
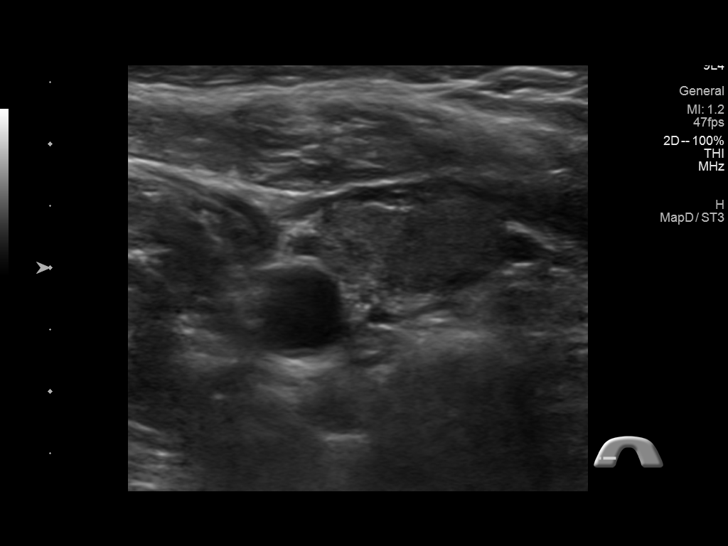
[im 19/76]
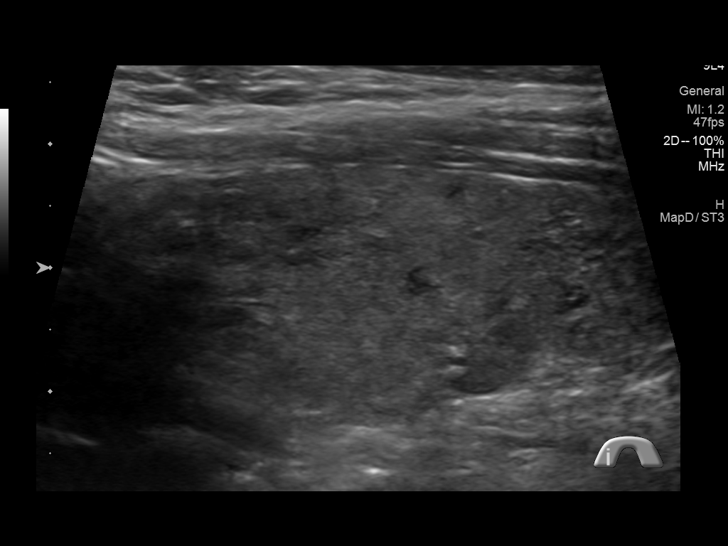
[im 26/76]
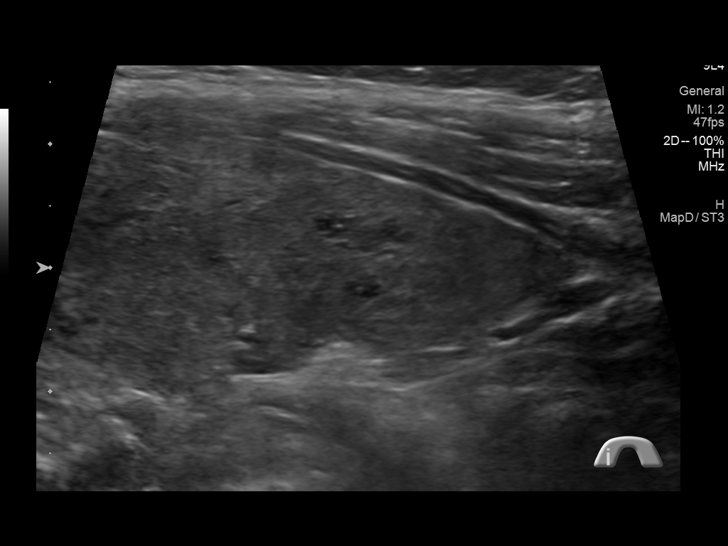
[im 29/76]
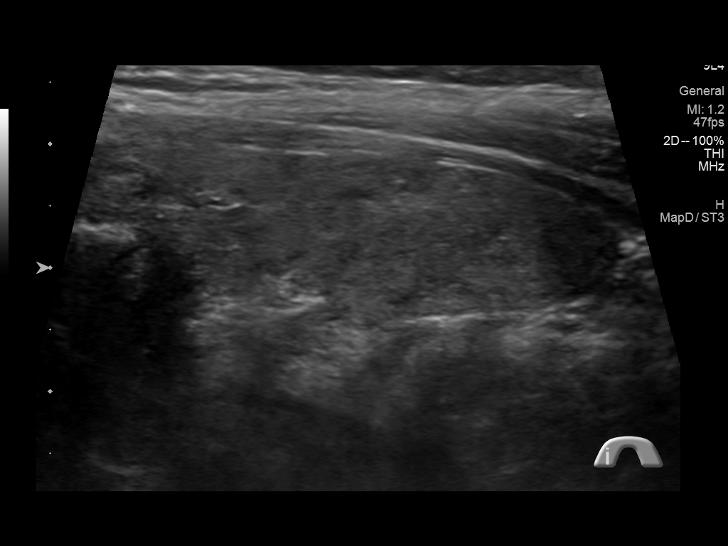
[im 35/76]
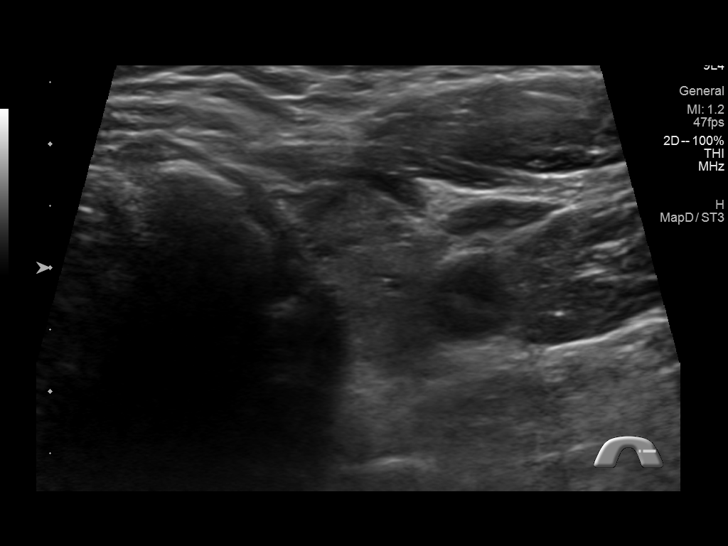
[im 41/76]
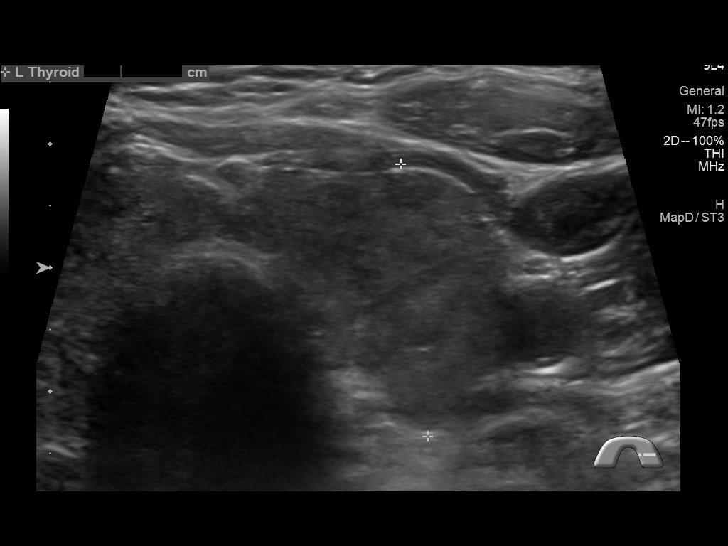
[im 47/76]
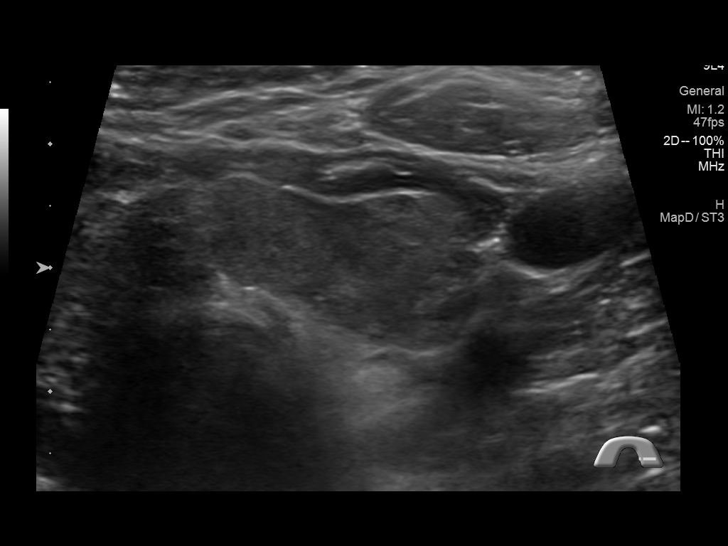
[im 51/76]
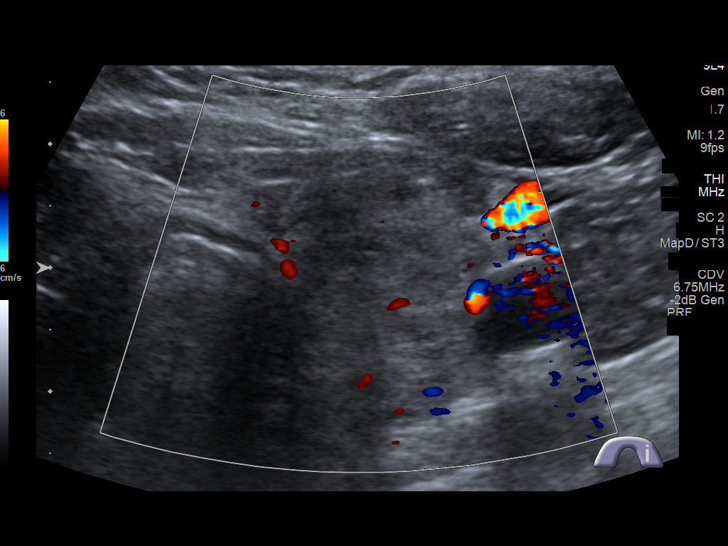
[im 57/76]
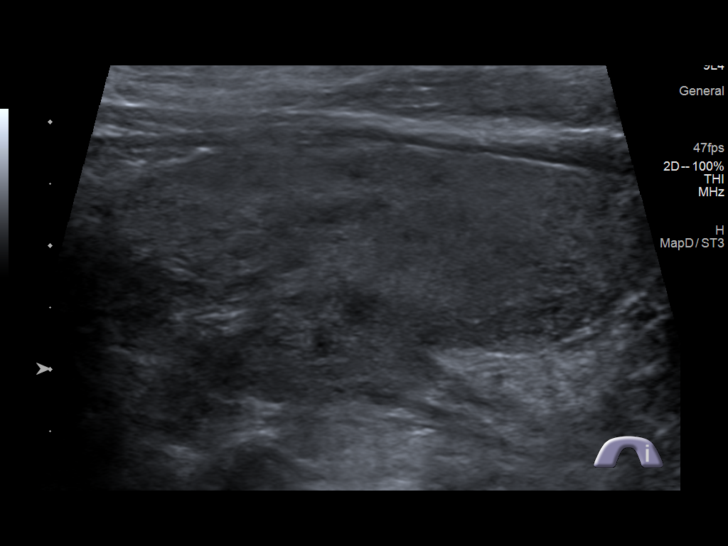
[im 63/76]
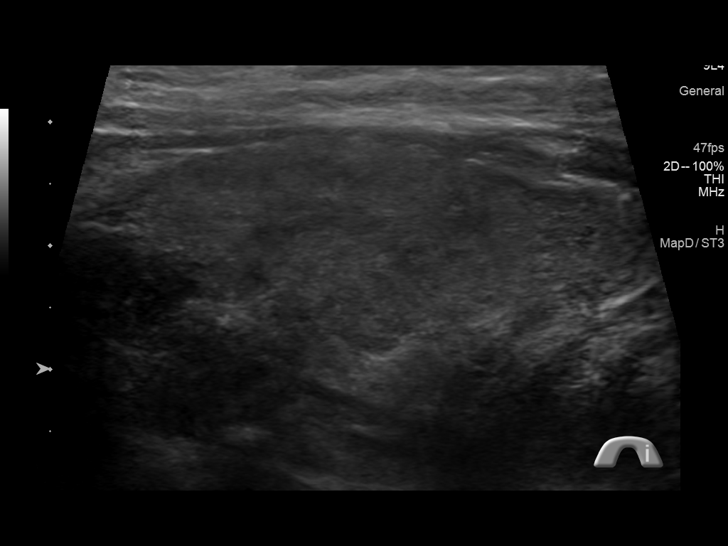
[im 69/76]
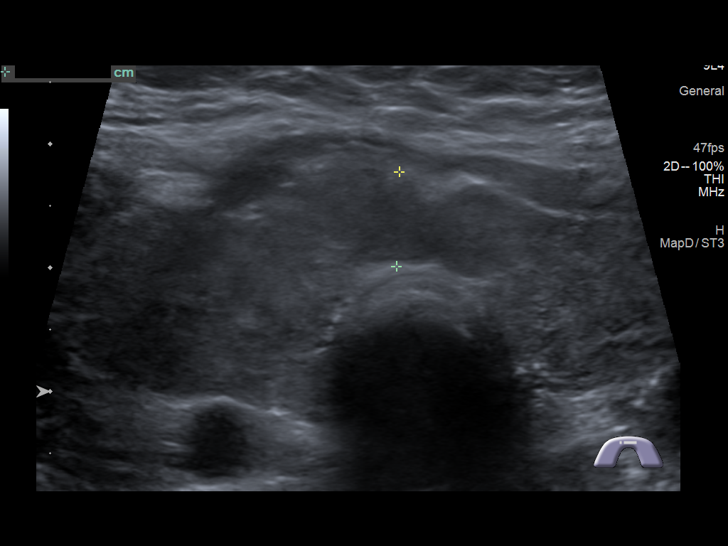
[im 76/76]
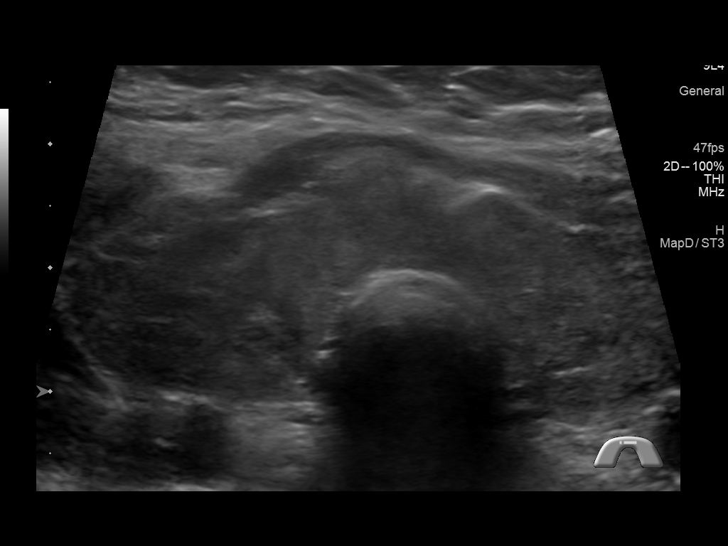

[14 of 25 positions shown; findings below may reference images not displayed]

FINDINGS: Parenchymal Echotexture: Moderately heterogenous - no definitive
glandular hyperemia.

Isthmus: Borderline enlarged measures 0.7 cm in diameter

Right lobe: Borderline enlarged measuring 5.2 x 2.0 x 1.9 cm

Left lobe: Borderline enlarged measuring 5.0 x 2.2 x 1.9 cm

_________________________________________________________

Estimated total number of nodules >/= 1 cm: 0

Number of spongiform nodules >/=  2 cm not described below (TR1): 0

Number of mixed cystic and solid nodules >/= 1.5 cm not described
below (TR2): 0

_________________________________________________________

No discrete nodules are seen within the thyroid gland.
IMPRESSION: Borderline enlarged and moderately heterogeneous appearing thyroid
gland without discrete nodule or mass.

## 2021-11-27 ENCOUNTER — Encounter: Payer: Self-pay | Admitting: Physician Assistant

## 2021-11-27 ENCOUNTER — Ambulatory Visit: Payer: Federal, State, Local not specified - PPO | Admitting: Physician Assistant

## 2021-11-27 ENCOUNTER — Other Ambulatory Visit: Payer: Self-pay

## 2021-11-27 DIAGNOSIS — L409 Psoriasis, unspecified: Secondary | ICD-10-CM

## 2021-11-27 MED ORDER — ENSTILAR 0.005-0.064 % EX FOAM
CUTANEOUS | 8 refills | Status: DC
Start: 2021-11-27 — End: 2022-02-17

## 2021-11-27 MED ORDER — CLOBETASOL PROPIONATE 0.05 % EX SOLN
CUTANEOUS | 8 refills | Status: AC
Start: 2021-11-27 — End: ?

## 2021-11-27 NOTE — Progress Notes (Signed)
° °  New Patient   Subjective  Yvonne Frey is a 56 y.o. female who presents for the following: New Patient (Initial Visit) (Patient here today for psoriasis x years per patient she is currently flaring on both hands, both elbows and scalp. Per patient she is currently using Clobetasol Ointment that's helping some. Patient's last injection of Stelara was 2021, per patient it did work well she would like to discuss other treatment options today. Patient is currently on prednisone and Tylenol for her psoriatic arthritis. 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    The following portions of the chart were reviewed this encounter and updated as appropriate:  Tobacco   Allergies   Meds   Problems   Med Hx   Surg Hx   Fam Hx       Objective  Well appearing patient in no apparent distress; mood and affect are within normal limits.  All skin waist up examined.  Left Mid Palm, Left Thenar Eminence, Right Mid Palm, Scalp Well-marginated erythematous plaques with silvery scale.       Assessment & Plan  Psoriasis Left Thenar Eminence; Left Mid Palm; Right Mid Palm; Scalp  clobetasol (TEMOVATE) 0.05 % external solution - Left Mid Palm, Left Thenar [place], Right Mid Palm, Scalp APPLY TO AFFECTED AREAS AS DIRECTED  Calcipotriene-Betameth Diprop (ENSTILAR) 0.005-0.064 % FOAM - Left Mid Palm, Left Thenar [place], Right Mid Palm, Scalp Apply to affected area qd  We will explore options for her Psoriasis and associated arthritis in 6 months.   No atypical nevi noted at the time of the visit.  I, Dyland Panuco, PA-C, have reviewed all documentation's for this visit.  The documentation on 11/27/21 for the exam, diagnosis, procedures and orders are all accurate and complete.

## 2021-11-29 HISTORY — PX: BREAST LUMPECTOMY: SHX2

## 2022-02-10 ENCOUNTER — Ambulatory Visit: Payer: Federal, State, Local not specified - PPO | Admitting: "Endocrinology

## 2022-02-14 LAB — T4, FREE: Free T4: 1.32 ng/dL (ref 0.82–1.77)

## 2022-02-14 LAB — TSH: TSH: 0.34 u[IU]/mL — ABNORMAL LOW (ref 0.450–4.500)

## 2022-02-17 ENCOUNTER — Encounter: Payer: Self-pay | Admitting: "Endocrinology

## 2022-02-17 ENCOUNTER — Ambulatory Visit (INDEPENDENT_AMBULATORY_CARE_PROVIDER_SITE_OTHER): Payer: Federal, State, Local not specified - PPO | Admitting: "Endocrinology

## 2022-02-17 VITALS — BP 106/58 | HR 60 | Ht 63.0 in | Wt 253.4 lb

## 2022-02-17 DIAGNOSIS — E063 Autoimmune thyroiditis: Secondary | ICD-10-CM | POA: Diagnosis not present

## 2022-02-17 DIAGNOSIS — E040 Nontoxic diffuse goiter: Secondary | ICD-10-CM | POA: Diagnosis not present

## 2022-02-17 DIAGNOSIS — E038 Other specified hypothyroidism: Secondary | ICD-10-CM

## 2022-02-17 NOTE — Progress Notes (Signed)
? ?                                       02/17/2022, 3:41 PM ?          ?                 ?Endocrinology follow-up note ? ?Yvonne Frey is a 56 y.o.-year-old female patient being seen in follow-up  for hypothyroidism referred by Yvonne Olden, MD. ? ? ?Past Medical History:  ?Diagnosis Date  ? Breast cancer ([place])   ? Hypothyroidism   ? ? ?Past Surgical History:  ?Procedure Laterality Date  ? BREAST LUMPECTOMY Left   ? ? ?Social History  ? ?Socioeconomic History  ? Marital status: Single  ?  Spouse name: Not on file  ? Number of children: Not on file  ? Years of education: Not on file  ? Highest education level: Not on file  ?Occupational History  ? Not on file  ?Tobacco Use  ? Smoking status: Never  ? Smokeless tobacco: Never  ?Substance and Sexual Activity  ? Alcohol use: Never  ? Drug use: Never  ? Sexual activity: Not on file  ?Other Topics Concern  ? Not on file  ?Social History Narrative  ? Not on file  ? ?Social Determinants of Health  ? ?Financial Resource Strain: Not on file  ?Food Insecurity: Not on file  ?Transportation Needs: Not on file  ?Physical Activity: Not on file  ?Stress: Not on file  ?Social Connections: Not on file  ? ? ?History reviewed. No pertinent family history. ? ?Outpatient Encounter Medications as of 02/17/2022  ?Medication Sig  ? Ascorbic Acid (VITAMIN C PO) Take 1 tablet by mouth daily.  ? TURMERIC CURCUMIN PO Take 1 tablet by mouth daily.  ? clobetasol (TEMOVATE) 0.05 % external solution APPLY TO AFFECTED AREAS AS DIRECTED  ? levothyroxine (SYNTHROID) 75 MCG tablet TAKE 1 TABLET BY MOUTH DAILY BEFORE BREAKFAST.  ? lisinopril-hydrochlorothiazide (ZESTORETIC) 10-12.5 MG tablet Take by mouth daily.  ? omeprazole (PRILOSEC) 40 MG capsule daily as needed.  ? [DISCONTINUED] Calcipotriene-Betameth Diprop (ENSTILAR) 0.005-0.064 % FOAM Apply to affected area qd  ? [DISCONTINUED] predniSONE (DELTASONE) 5 MG tablet PLEASE SEE ATTACHED FOR DETAILED DIRECTIONS  ? ?No  facility-administered encounter medications on file as of 02/17/2022.  ? ? ?ALLERGIES: ?Allergies  ?Allergen Reactions  ? Sulfa Antibiotics Palpitations  ? ?VACCINATION STATUS: ? ?There is no immunization history on file for this patient. ? ? ?HPI  ? ? ?Yvonne Frey  is a patient with the above medical history. she was diagnosed with hypothyroidism at approximate age of 13 years which required initiation of thyroid hormone replacement.  She is currently on levothyroxine 75 mcg p.o. daily before breakfast.  She continues to tolerate this medication, has no new complaints.  In the interval, she was diagnosed with breast cancer s/p lumpectomy, radiation therapy.  She did not receive chemotherapy.  ?  Etiology of her hypothyroidism was determined to be Hashimoto's thyroiditis.  ? ? ?-She denies personal history of goiter, denies dysphagia, shortness of breath, but reports occasional voice change.  She continues to lose weight up to 20 pounds since last visit.  She explains most of his weight loss is intentional. ? ? ?she reports family history of  thyroid disorders in her mother who is taking what appears to be thyroid hormone replacement.  No family history of  thyroid cancer.  ?No history of  radiation therapy to head or neck. ?No recent use of iodine supplements. ? ? ?ROS: ?Limited as above. ? ? ?Physical Exam: ?BP (!) 106/58   Pulse 60   Ht '5\' 3"'$  (1.6 m)   Wt 253 lb 6.4 oz (114.9 kg)   BMI 44.89 kg/m?  ?Wt Readings from Last 3 Encounters:  ?02/17/22 253 lb 6.4 oz (114.9 kg)  ?08/09/21 249 lb (112.9 kg)  ?02/07/21 271 lb 6.4 oz (123.1 kg)  ? ?Recent Results (from the past 2160 hour(s))  ?TSH     Status: Abnormal  ? Collection Time: 02/13/22  1:58 PM  ?Result Value Ref Range  ? TSH 0.340 (L) 0.450 - 4.500 uIU/mL  ?T4, free     Status: None  ? Collection Time: 02/13/22  1:58 PM  ?Result Value Ref Range  ? Free T4 1.32 0.82 - 1.77 ng/dL  ? ? ?Thyroid ultrasound from August 16, 2019 ?No discrete nodules are seen  within the thyroid gland. ?  ?IMPRESSION: ?Borderline enlarged and moderately heterogeneous appearing thyroid ?gland without discrete nodule or mass. ?  ? ?Mar 15, 2019 thyroid function test: TSH 2.94, free T4 0.82 ? ?ASSESSMENT: ?1. Hypothyroidism-due to Hashimoto's thyroiditis ?2.  Simple goiter ? ?PLAN:  ? ?-Her previsit thyroid function tests are consistent with appropriate replacement.  She is advised to continue levothyroxine 75 mcg p.o. daily before breakfast.   ? ? - We discussed about the correct intake of her thyroid hormone, on empty stomach at fasting, with water, separated by at least 30 minutes from breakfast and other medications,  and separated by more than 4 hours from calcium, iron, multivitamins, acid reflux medications (PPIs). ?-Patient is made aware of the fact that thyroid hormone replacement is needed for life, dose to be adjusted by periodic monitoring of thyroid function tests. ? ? ?-Her thyroid ultrasound is consistent with simple moderately heterogeneous appearing thyroid gland without discrete nodules or mass.  ?She will not need antithyroid intervention at this time.  She may need surveillance thyroid ultrasound after her next visit.  Marland Kitchen ? ?She is advised to maintain close follow-up with her PMD. ? ? ? ?I spent 21 minutes in the care of the patient today including review of labs from Thyroid Function, CMP, and other relevant labs ; imaging/biopsy records (current and previous including abstractions from other facilities); face-to-face time discussing  her lab results and symptoms, medications doses, her options of short and long term treatment based on the latest standards of care / guidelines;   and documenting the encounter. ? ?Yvonne Frey  participated in the discussions, expressed understanding, and voiced agreement with the above plans.  All questions were answered to her satisfaction. she is encouraged to contact clinic should she have any questions or concerns prior to her return  visit. ? ? ? ?Return in about 6 months (around 08/19/2022) for F/U with Pre-visit Labs. ? ?Yvonne Lloyd, MD ?[place] ?[facility] ?[address] ?[place], [place] [address] ?Phone: [phone]  Fax: [phone]  ? ?02/17/2022, 3:41 PM ? ?This note was partially dictated with voice recognition software. Similar sounding words can be transcribed inadequately or may not  be corrected upon review. ?

## 2022-03-18 ENCOUNTER — Other Ambulatory Visit: Payer: Self-pay | Admitting: "Endocrinology

## 2022-05-28 ENCOUNTER — Ambulatory Visit: Payer: Federal, State, Local not specified - PPO | Admitting: Physician Assistant

## 2022-06-15 ENCOUNTER — Other Ambulatory Visit: Payer: Self-pay | Admitting: "Endocrinology

## 2022-08-18 ENCOUNTER — Ambulatory Visit: Payer: Federal, State, Local not specified - PPO | Admitting: "Endocrinology

## 2022-08-22 ENCOUNTER — Ambulatory Visit: Payer: Federal, State, Local not specified - PPO | Admitting: "Endocrinology

## 2022-08-26 HISTORY — PX: COLONOSCOPY: SHX174

## 2022-09-09 ENCOUNTER — Ambulatory Visit: Payer: Federal, State, Local not specified - PPO | Admitting: "Endocrinology

## 2022-09-09 LAB — TSH: TSH: 1.71 u[IU]/mL (ref 0.450–4.500)

## 2022-09-09 LAB — T4, FREE: Free T4: 1.06 ng/dL (ref 0.82–1.77)

## 2022-09-12 ENCOUNTER — Encounter: Payer: Self-pay | Admitting: "Endocrinology

## 2022-09-12 ENCOUNTER — Ambulatory Visit: Payer: Federal, State, Local not specified - PPO | Admitting: "Endocrinology

## 2022-09-12 VITALS — BP 122/66 | HR 60 | Ht 63.0 in | Wt 266.6 lb

## 2022-09-12 DIAGNOSIS — E063 Autoimmune thyroiditis: Secondary | ICD-10-CM

## 2022-09-12 DIAGNOSIS — E038 Other specified hypothyroidism: Secondary | ICD-10-CM | POA: Diagnosis not present

## 2022-09-12 DIAGNOSIS — E040 Nontoxic diffuse goiter: Secondary | ICD-10-CM | POA: Diagnosis not present

## 2022-09-12 NOTE — Progress Notes (Unsigned)
09/12/2022, 2:08 PM                            Endocrinology follow-up note  Yvonne Frey is a 55 y.o.-year-old female patient being seen in follow-up  for hypothyroidism referred by Cathie Olden, MD.   Past Medical History:  Diagnosis Date   Breast cancer [facility])    Hypothyroidism     Past Surgical History:  Procedure Laterality Date   BREAST LUMPECTOMY Left 11/29/2021   COLONOSCOPY  08/26/2022    Social History   Socioeconomic History   Marital status: Single    Spouse name: Not on file   Number of children: Not on file   Years of education: Not on file   Highest education level: Not on file  Occupational History   Not on file  Tobacco Use   Smoking status: Never   Smokeless tobacco: Never  Substance and Sexual Activity   Alcohol use: Never   Drug use: Never   Sexual activity: Not on file  Other Topics Concern   Not on file  Social History Narrative   Not on file   Social Determinants of Health   Financial Resource Strain: Not on file  Food Insecurity: Not on file  Transportation Needs: Not on file  Physical Activity: Not on file  Stress: Not on file  Social Connections: Not on file    History reviewed. No pertinent family history.  Outpatient Encounter Medications as of 09/12/2022  Medication Sig   anastrozole (ARIMIDEX) 1 MG tablet Take 1 mg by mouth daily.   Calcium Carb-Cholecalciferol 600-5 MG-MCG TABS Take 1 tablet by mouth 2 (two) times daily.   clobetasol (TEMOVATE) 0.05 % external solution APPLY TO AFFECTED AREAS AS DIRECTED   levothyroxine (SYNTHROID) 75 MCG tablet TAKE 1 TABLET BY MOUTH EVERY DAY BEFORE BREAKFAST   lisinopril-hydrochlorothiazide (ZESTORETIC) 10-12.5 MG tablet Take by mouth daily.   omeprazole (PRILOSEC) 40 MG capsule daily as needed.   [DISCONTINUED] Ascorbic Acid (VITAMIN C PO) Take 1 tablet by mouth daily.   [DISCONTINUED] TURMERIC CURCUMIN PO Take 1 tablet by mouth  daily.   No facility-administered encounter medications on file as of 09/12/2022.    ALLERGIES: Allergies  Allergen Reactions   Sulfa Antibiotics Palpitations   VACCINATION STATUS:  There is no immunization history on file for this patient.   HPI    Yvonne Frey  is a patient with the above medical history. she was diagnosed with hypothyroidism at approximate age of 60 years which required initiation of thyroid hormone replacement.  She is currently on levothyroxine 75 mcg p.o. daily before breakfast.  She continues to tolerate this medication, has no new complaints.  In the interval, she was diagnosed with breast cancer s/p lumpectomy, radiation therapy.  She did not receive chemotherapy.    Etiology of her hypothyroidism was determined to be Hashimoto's thyroiditis.    -She denies personal history of goiter, denies dysphagia, shortness of breath, but reports occasional voice change.  She continues to lose weight up to 20 pounds since last visit.  She explains most of his weight loss is intentional.   she reports family history of  thyroid disorders in her mother who is taking what appears to  be thyroid hormone replacement.  No family history of thyroid cancer.  No history of  radiation therapy to head or neck. No recent use of iodine supplements.   ROS: Limited as above.   Physical Exam: BP 122/66   Pulse 60   Ht '5\' 3"'$  (1.6 m)   Wt 266 lb 9.6 oz (120.9 kg)   BMI 47.23 kg/m  Wt Readings from Last 3 Encounters:  09/12/22 266 lb 9.6 oz (120.9 kg)  02/17/22 253 lb 6.4 oz (114.9 kg)  08/09/21 249 lb (112.9 kg)   Recent Results (from the past 2160 hour(s))  TSH     Status: None   Collection Time: 09/08/22  9:04 AM  Result Value Ref Range   TSH 1.710 0.450 - 4.500 uIU/mL  T4, free     Status: None   Collection Time: 09/08/22  9:04 AM  Result Value Ref Range   Free T4 1.06 0.82 - 1.77 ng/dL    Thyroid ultrasound from August 16, 2019 No discrete nodules are seen  within the thyroid gland.   IMPRESSION: Borderline enlarged and moderately heterogeneous appearing thyroid gland without discrete nodule or mass.    Mar 15, 2019 thyroid function test: TSH 2.94, free T4 0.82  ASSESSMENT: 1. 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  Simple goiter  PLAN:   -Her previsit thyroid function tests are consistent with appropriate replacement.  She is advised to continue levothyroxine 75 mcg p.o. daily before breakfast.     - We discussed about the correct intake of her thyroid hormone, on empty stomach at fasting, with water, separated by at least 30 minutes from breakfast and other medications,  and separated by more than 4 hours from calcium, iron, multivitamins, acid reflux medications (PPIs). -Patient is made aware of the fact that thyroid hormone replacement is needed for life, dose to be adjusted by periodic monitoring of thyroid function tests.   -Her thyroid ultrasound is consistent with simple moderately heterogeneous appearing thyroid gland without discrete nodules or mass.  She will not need antithyroid intervention at this time.  She may need surveillance thyroid ultrasound after her next visit.  .  She is advised to maintain close follow-up with her PMD.    I spent 21 minutes in the care of the patient today including review of labs from Thyroid Function, CMP, and other relevant labs ; imaging/biopsy records (current and previous including abstractions from other facilities); face-to-face time discussing  her lab results and symptoms, medications doses, her options of short and long term treatment based on the latest standards of care / guidelines;   and documenting the encounter.  Mertie Clause  participated in the discussions, expressed understanding, and voiced agreement with the above plans.  All questions were answered to her satisfaction. she is encouraged to contact clinic should she have any questions or concerns prior to her return  visit.    Return in about 6 months (around 03/13/2023) for F/U with Pre-visit Labs.  Glade Lloyd, MD [facility] [address] [place], [place] [address] Phone: [phone]  Fax: [phone]   09/12/2022, 2:08 PM  This note was partially dictated with voice recognition software. Similar sounding words can be transcribed inadequately or may not  be corrected upon review.

## 2022-09-18 ENCOUNTER — Other Ambulatory Visit: Payer: Self-pay | Admitting: "Endocrinology

## 2022-12-25 ENCOUNTER — Other Ambulatory Visit: Payer: Self-pay | Admitting: "Endocrinology

## 2023-03-11 LAB — TSH: TSH: 1.02 u[IU]/mL (ref 0.450–4.500)

## 2023-03-11 LAB — T4, FREE: Free T4: 1.25 ng/dL (ref 0.82–1.77)

## 2023-03-13 ENCOUNTER — Ambulatory Visit: Payer: Federal, State, Local not specified - PPO | Admitting: "Endocrinology

## 2023-03-13 ENCOUNTER — Encounter: Payer: Self-pay | Admitting: "Endocrinology

## 2023-03-13 VITALS — BP 118/50 | HR 68 | Ht 63.0 in | Wt 281.0 lb

## 2023-03-13 DIAGNOSIS — E040 Nontoxic diffuse goiter: Secondary | ICD-10-CM

## 2023-03-13 DIAGNOSIS — I10 Essential (primary) hypertension: Secondary | ICD-10-CM | POA: Insufficient documentation

## 2023-03-13 DIAGNOSIS — E038 Other specified hypothyroidism: Secondary | ICD-10-CM | POA: Diagnosis not present

## 2023-03-13 DIAGNOSIS — E063 Autoimmune thyroiditis: Secondary | ICD-10-CM

## 2023-03-13 MED ORDER — LEVOTHYROXINE SODIUM 75 MCG PO TABS: ORAL_TABLET | ORAL | 0 refills | Status: DC

## 2023-03-13 NOTE — Patient Instructions (Signed)
                                     Advice for Weight Management  -For most of us the best way to lose weight is by diet management. Generally speaking, diet management means consuming less calories intentionally which over time brings about progressive weight loss.  This can be achieved more effectively by avoiding ultra processed carbohydrates, processed meats, unhealthy fats.    It is critically important to know your numbers: how much calorie you are consuming and how much calorie you need. More importantly, our carbohydrates sources should be unprocessed naturally occurring  complex starch food items.  It is always important to balance nutrition also by  appropriate intake of proteins (mainly plant-based), healthy fats/oils, plenty of fruits and vegetables.   -The American College of Lifestyle Medicine (ACL M) recommends nutrition derived mostly from Whole Food, Plant Predominant Sources example an apple instead of applesauce or apple pie. Eat Plenty of vegetables, Mushrooms, fruits, Legumes, Whole Grains, Nuts, seeds in lieu of processed meats, processed snacks/pastries red meat, poultry, eggs.  Use only water or unsweetened tea for hydration.  The College also recommends the need to stay away from risky substances including alcohol, smoking; obtaining 7-9 hours of restorative sleep, at least 150 minutes of moderate intensity exercise weekly, importance of healthy social connections, and being mindful of stress and seek help when it is overwhelming.    -Sticking to a routine mealtime to eat 3 meals a day and avoiding unnecessary snacks is shown to have a big role in weight control. Under normal circumstances, the only time we burn stored energy is when we are hungry, so allow  some hunger to take place- hunger means no food between appropriate meal times, only water.  It is not advisable to starve.   -It is better to avoid simple carbohydrates including:  Cakes, Sweet Desserts, Ice Cream, Soda (diet and regular), Sweet Tea, Candies, Chips, Cookies, Store Bought Juices, Alcohol in Excess of  1-2 drinks a day, Lemonade,  Artificial Sweeteners, Doughnuts, Coffee Creamers, "Sugar-free" Products, etc, etc.  This is not a complete list.....    -Consulting with certified diabetes educators is proven to provide you with the most accurate and current information on diet.  Also, you may be  interested in discussing diet options/exchanges , we can schedule a visit with Yvonne Frey, RDN, CDE for individualized nutrition education.  -Exercise: If you are able: 30 -60 minutes a day ,4 days a week, or 150 minutes of moderate intensity exercise weekly.    The longer the better if tolerated.  Combine stretch, strength, and aerobic activities.  If you were told in the past that you have high risk for cardiovascular diseases, or if you are currently symptomatic, you may seek evaluation by your heart doctor prior to initiating moderate to intense exercise programs.                                  Additional Care Considerations for Diabetes/Prediabetes   -Diabetes  is a chronic disease.  The most important care consideration is regular follow-up with your diabetes care provider with the goal being avoiding or delaying its complications and to take advantage of advances in medications and technology.  If appropriate actions are taken early enough, type 2 diabetes can even be   reversed.  Seek information from the right source.  - Whole Food, Plant Predominant Nutrition is highly recommended: Eat Plenty of vegetables, Mushrooms, fruits, Legumes, Whole Grains, Nuts, seeds in lieu of processed meats, processed snacks/pastries red meat, poultry, eggs as recommended by American College of  Lifestyle Medicine (ACLM).  -Type 2 diabetes is known to coexist with other important comorbidities such as high blood pressure and high cholesterol.  It is critical to control not only the  diabetes but also the high blood pressure and high cholesterol to minimize and delay the risk of complications including coronary artery disease, stroke, amputations, blindness, etc.  The good news is that this diet recommendation for type 2 diabetes is also very helpful for managing high cholesterol and high blood blood pressure.  - Studies showed that people with diabetes will benefit from a class of medications known as ACE inhibitors and statins.  Unless there are specific reasons not to be on these medications, the standard of care is to consider getting one from these groups of medications at an optimal doses.  These medications are generally considered safe and proven to help protect the heart and the kidneys.    - People with diabetes are encouraged to initiate and maintain regular follow-up with eye doctors, foot doctors, dentists , and if necessary heart and kidney doctors.     - It is highly recommended that people with diabetes quit smoking or stay away from smoking, and get yearly  flu vaccine and pneumonia vaccine at least every 5 years.  See above for additional recommendations on exercise, sleep, stress management , and healthy social connections.      

## 2023-03-13 NOTE — Progress Notes (Signed)
03/13/2023, 1:11 PM                            Endocrinology follow-up note  Yvonne Frey is a 57 y.o.-year-old female patient being seen in follow-up  for hypothyroidism referred by Valla Leaver, MD.   Past Medical History:  Diagnosis Date   Breast cancer [facility])    Hypothyroidism     Past Surgical History:  Procedure Laterality Date   BREAST LUMPECTOMY Left 11/29/2021   COLONOSCOPY  08/26/2022    Social History   Socioeconomic History   Marital status: Single    Spouse name: Not on file   Number of children: Not on file   Years of education: Not on file   Highest education level: Not on file  Occupational History   Not on file  Tobacco Use   Smoking status: Never   Smokeless tobacco: Never  Substance and Sexual Activity   Alcohol use: Never   Drug use: Never   Sexual activity: Not on file  Other Topics Concern   Not on file  Social History Narrative   Not on file   Social Determinants of Health   Financial Resource Strain: Not on file  Food Insecurity: Not on file  Transportation Needs: Not on file  Physical Activity: Not on file  Stress: Not on file  Social Connections: Not on file    History reviewed. No pertinent family history.  Outpatient Encounter Medications as of 03/13/2023  Medication Sig   anastrozole (ARIMIDEX) 1 MG tablet Take 1 mg by mouth daily.   Calcium Carb-Cholecalciferol 600-5 MG-MCG TABS Take 1 tablet by mouth 2 (two) times daily.   clobetasol (TEMOVATE) 0.05 % external solution APPLY TO AFFECTED AREAS AS DIRECTED   levothyroxine (SYNTHROID) 75 MCG tablet TAKE 1 TABLET BY MOUTH EVERY DAY BEFORE BREAKFAST   lisinopril-hydrochlorothiazide (ZESTORETIC) 10-12.5 MG tablet Take by mouth daily.   omeprazole (PRILOSEC) 40 MG capsule daily as needed.   predniSONE (DELTASONE) 5 MG tablet Take 5 mg by mouth daily with breakfast.   [DISCONTINUED] levothyroxine (SYNTHROID) 75 MCG tablet TAKE 1  TABLET BY MOUTH EVERY DAY BEFORE BREAKFAST   No facility-administered encounter medications on file as of 03/13/2023.    ALLERGIES: Allergies  Allergen Reactions   Sulfa Antibiotics Palpitations   VACCINATION STATUS:  There is no immunization history on file for this patient.   HPI    Yvonne Frey  is a patient with the above medical history. she was diagnosed with hypothyroidism at approximate age of 44 years which required initiation of thyroid hormone replacement.  She is currently on levothyroxine 75 mcg p.o. daily before breakfast.     She continues to tolerate this medication, has no new complaints.  In the interval, she was diagnosed with breast cancer s/p lumpectomy, radiation therapy.  She did not receive chemotherapy.    Etiology of her hypothyroidism was determined to be Hashimoto's thyroiditis.   -She denies personal history of goiter, denies dysphagia, shortness of breath, but reports occasional voice change.  She returns with some weight gain.   she reports family history of  thyroid disorders in her mother who is taking what appears to be thyroid hormone replacement.  No family history  of thyroid cancer.  No history of  radiation therapy to head or neck. -She was recently initiated on prednisone 5 mg p.o. daily for psoriatic arthritis.   ROS: Limited as above.   Physical Exam: BP (!) 118/50   Pulse 68   Ht 5\' 3"  (1.6 m)   Wt 281 lb (127.5 kg)   BMI 49.78 kg/m  Wt Readings from Last 3 Encounters:  03/13/23 281 lb (127.5 kg)  09/12/22 266 lb 9.6 oz (120.9 kg)  02/17/22 253 lb 6.4 oz (114.9 kg)   Recent Results (from the past 2160 hour(s))  TSH     Status: None   Collection Time: 03/10/23  3:33 PM  Result Value Ref Range   TSH 1.020 0.450 - 4.500 uIU/mL  T4, free     Status: None   Collection Time: 03/10/23  3:33 PM  Result Value Ref Range   Free T4 1.25 0.82 - 1.77 ng/dL    Thyroid ultrasound from August 16, 2019 No discrete nodules are seen within  the thyroid gland.   IMPRESSION: Borderline enlarged and moderately heterogeneous appearing thyroid gland without discrete nodule or mass.    Recent Results (from the past 2160 hour(s))  TSH     Status: None   Collection Time: 03/10/23  3:33 PM  Result Value Ref Range   TSH 1.020 0.450 - 4.500 uIU/mL  T4, free     Status: None   Collection Time: 03/10/23  3:33 PM  Result Value Ref Range   Free T4 1.25 0.82 - 1.77 ng/dL     ASSESSMENT: 1. 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  Simple goiter 3.  High BMI of 49.78  PLAN:   -Her previsit thyroid function tests are consistent with appropriate replacement.  She is advised to continue levothyroxine 75 mcg p.o. daily before breakfast.     - We discussed about the correct intake of her thyroid hormone, on empty stomach at fasting, with water, separated by at least 30 minutes from breakfast and other medications,  and separated by more than 4 hours from calcium, iron, multivitamins, acid reflux medications (PPIs). -Patient is made aware of the fact that thyroid hormone replacement is needed for life, dose to be adjusted by periodic monitoring of thyroid function tests.  -Her thyroid ultrasound is consistent with simple moderately heterogeneous appearing thyroid gland without discrete nodules or mass.  She will not need antithyroid intervention at this time.  She may need surveillance thyroid ultrasound after her next visit.  In light of her metabolic dysfunction indicated by obesity, hypertension , psoriatic arthritis requiring steroid management, she may benefit from anti-inflammatory, whole food plant-based diet which was discussed and recommended to her.  She is advised to maintain close follow-up with her PMD.   I spent  19  minutes in the care of the patient today including review of labs from Thyroid Function, CMP, and other relevant labs ; imaging/biopsy records (current and previous including abstractions from other  facilities); face-to-face time discussing  her lab results and symptoms, medications doses, her options of short and long term treatment based on the latest standards of care / guidelines;   and documenting the encounter.  Dinah Beers  participated in the discussions, expressed understanding, and voiced agreement with the above plans.  All questions were answered to her satisfaction. she is encouraged to contact clinic should she have any questions or concerns prior to her return visit.     Return in about 6 months (around 09/13/2023) for F/U  with Pre-visit Labs.  Marquis Lunch, MD [facility] [address] [place], [place] [address] Phone: [phone]  Fax: [phone]   03/13/2023, 1:11 PM  This note was partially dictated with voice recognition software. Similar sounding words can be transcribed inadequately or may not  be corrected upon review.

## 2023-03-21 ENCOUNTER — Other Ambulatory Visit: Payer: Self-pay | Admitting: "Endocrinology

## 2023-04-23 ENCOUNTER — Other Ambulatory Visit: Payer: Self-pay | Admitting: "Endocrinology

## 2023-07-21 ENCOUNTER — Other Ambulatory Visit: Payer: Self-pay | Admitting: "Endocrinology

## 2023-09-11 LAB — TSH: TSH: 1.11 u[IU]/mL (ref 0.450–4.500)

## 2023-09-11 LAB — T4, FREE: Free T4: 1.16 ng/dL (ref 0.82–1.77)

## 2023-09-17 ENCOUNTER — Ambulatory Visit: Payer: Federal, State, Local not specified - PPO | Admitting: "Endocrinology

## 2023-09-17 ENCOUNTER — Encounter: Payer: Self-pay | Admitting: "Endocrinology

## 2023-09-17 VITALS — BP 116/64 | HR 60 | Ht 63.0 in | Wt 254.2 lb

## 2023-09-17 DIAGNOSIS — E063 Autoimmune thyroiditis: Secondary | ICD-10-CM

## 2023-09-17 DIAGNOSIS — E040 Nontoxic diffuse goiter: Secondary | ICD-10-CM | POA: Diagnosis not present

## 2023-09-17 MED ORDER — LEVOTHYROXINE SODIUM 75 MCG PO TABS: ORAL_TABLET | ORAL | 1 refills | Status: DC

## 2023-09-17 NOTE — Progress Notes (Signed)
09/17/2023, 12:23 PM                            Endocrinology follow-up note  Yvonne Frey is a 57 y.o.-year-old female patient being seen in follow-up  for hypothyroidism referred by Valla Leaver, MD.   Past Medical History:  Diagnosis Date   Breast cancer [facility])    Hypothyroidism     Past Surgical History:  Procedure Laterality Date   BREAST LUMPECTOMY Left 11/29/2021   COLONOSCOPY  08/26/2022    Social History   Socioeconomic History   Marital status: Single    Spouse name: Not on file   Number of children: Not on file   Years of education: Not on file   Highest education level: Not on file  Occupational History   Not on file  Tobacco Use   Smoking status: Never   Smokeless tobacco: Never  Substance and Sexual Activity   Alcohol use: Never   Drug use: Never   Sexual activity: Not on file  Other Topics Concern   Not on file  Social History Narrative   Not on file   Social Determinants of Health   Financial Resource Strain: Not on file  Food Insecurity: No Food Insecurity (08/01/2019)   Received from [facility], [facility]   Hunger Vital Sign    Worried About Running Out of Food in the Last Year: Never true    Ran Out of Food in the Last Year: Never true  Transportation Needs: No Transportation Needs (08/01/2019)   Received from [facility], [facility]   [facility] - Transportation    Lack of Transportation (Medical): No    Lack of Transportation (Non-Medical): No  Physical Activity: Not on file  Stress: No Stress Concern Present (08/01/2019)   Received from [facility], [facility] of Occupational Health - Occupational Stress Questionnaire    Feeling of Stress : Not at all  Social Connections: Not on file    History reviewed. No pertinent family history.  Outpatient Encounter Medications as of 09/17/2023  Medication Sig   anastrozole (ARIMIDEX) 1 MG  tablet Take 1 mg by mouth daily.   Calcium Carb-Cholecalciferol 600-5 MG-MCG TABS Take 1 tablet by mouth 2 (two) times daily.   clobetasol (TEMOVATE) 0.05 % external solution APPLY TO AFFECTED AREAS AS DIRECTED   levothyroxine (SYNTHROID) 75 MCG tablet TAKE 1 TABLET BY MOUTH EVERY DAY BEFORE BREAKFAST   lisinopril-hydrochlorothiazide (ZESTORETIC) 10-12.5 MG tablet Take by mouth daily.   omeprazole (PRILOSEC) 40 MG capsule daily as needed.   STELARA 90 MG/ML SOSY injection Inject 90 mg into the skin. Inject every 90 days   [DISCONTINUED] levothyroxine (SYNTHROID) 75 MCG tablet TAKE 1 TABLET BY MOUTH EVERY DAY BEFORE BREAKFAST   [DISCONTINUED] predniSONE (DELTASONE) 5 MG tablet Take 5 mg by mouth daily with breakfast.   No facility-administered encounter medications on file as of 09/17/2023.    ALLERGIES: Allergies  Allergen Reactions   Sulfa Antibiotics Palpitations   VACCINATION STATUS:  There is no immunization history on file for this patient.   HPI    Yvonne Frey  is a patient with the above medical history. she was  diagnosed with hypothyroidism at approximate age of 17 years which required initiation of thyroid hormone replacement.  She is currently on levothyroxine 75 mcg p.o. daily before breakfast.   She returns for follow-up with repeat thyroid function test.  She continues to tolerate this medication, reports no new complaints.  She has achieved 27 pounds of weight loss since last visit.  Recently, she was diagnosed with breast cancer s/p lumpectomy, radiation therapy.  She did not receive chemotherapy.    Etiology of her hypothyroidism was determined to be Hashimoto's thyroiditis.   -She denies personal history of goiter, denies dysphagia, shortness of breath, but reports occasional voice change.    she reports family history of  thyroid disorders in her mother who is taking what appears to be thyroid hormone replacement.  No family history of thyroid cancer.  No history  of  radiation therapy to head or neck.  -She was recently initiated on prednisone 5 mg p.o. daily for psoriatic arthritis.   ROS: Limited as above.   Physical Exam: BP 116/64   Pulse 60   Ht 5\' 3"  (1.6 m)   Wt 254 lb 3.2 oz (115.3 kg)   BMI 45.03 kg/m  Wt Readings from Last 3 Encounters:  09/17/23 254 lb 3.2 oz (115.3 kg)  03/13/23 281 lb (127.5 kg)  09/12/22 266 lb 9.6 oz (120.9 kg)   Recent Results (from the past 2160 hour(s))  TSH     Status: None   Collection Time: 09/10/23  3:32 PM  Result Value Ref Range   TSH 1.110 0.450 - 4.500 uIU/mL  T4, free     Status: None   Collection Time: 09/10/23  3:32 PM  Result Value Ref Range   Free T4 1.16 0.82 - 1.77 ng/dL    Thyroid ultrasound from August 16, 2019 No discrete nodules are seen within the thyroid gland.   IMPRESSION: Borderline enlarged and moderately heterogeneous appearing thyroid gland without discrete nodule or mass.    Recent Results (from the past 2160 hour(s))  TSH     Status: None   Collection Time: 09/10/23  3:32 PM  Result Value Ref Range   TSH 1.110 0.450 - 4.500 uIU/mL  T4, free     Status: None   Collection Time: 09/10/23  3:32 PM  Result Value Ref Range   Free T4 1.16 0.82 - 1.77 ng/dL     ASSESSMENT: 1. 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  Simple goiter 3.  High BMI of 49.78  PLAN:   -Her previsit thyroid function tests are consistent with appropriate replacement.  She is advised to continue levothyroxine 75 mcg p.o. daily before breakfast.    - We discussed about the correct intake of her thyroid hormone, on empty stomach at fasting, with water, separated by at least 30 minutes from breakfast and other medications,  and separated by more than 4 hours from calcium, iron, multivitamins, acid reflux medications (PPIs). -Patient is made aware of the fact that thyroid hormone replacement is needed for life, dose to be adjusted by periodic monitoring of thyroid function  tests.   -Prior to her last visit, her thyroid ultrasound was consistent with simple moderately heterogeneous appearing thyroid gland without discrete nodules or mass.  She will not need antithyroid intervention at this time.  She will be considered for repeat/surveillance thyroid ultrasound after her next visit.    In light of her metabolic dysfunction indicated by obesity, hypertension , psoriatic arthritis requiring steroid management, she was advised on lifestyle  medicine including whole food plant-based diet.  She seems to be responding by losing 27 pounds overall so far.  She is encouraged to stay on this modified lifestyle .  She is advised to maintain close follow-up with her PMD.    I spent  22  minutes in the care of the patient today including review of labs from Thyroid Function, CMP, and other relevant labs ; imaging/biopsy records (current and previous including abstractions from other facilities); face-to-face time discussing  her lab results and symptoms, medications doses, her options of short and long term treatment based on the latest standards of care / guidelines;   and documenting the encounter.  Dinah Beers  participated in the discussions, expressed understanding, and voiced agreement with the above plans.  All questions were answered to her satisfaction. she is encouraged to contact clinic should she have any questions or concerns prior to her return visit.   Return in about 6 months (around 03/16/2024) for Fasting Labs  in AM B4 8.  Marquis Lunch, MD [facility] [address] [place], [place] [address] Phone: [phone]  Fax: [phone]   09/17/2023, 12:23 PM  This note was partially dictated with voice recognition software. Similar sounding words can be transcribed inadequately or may not  be corrected upon review.

## 2023-09-18 ENCOUNTER — Ambulatory Visit: Payer: Federal, State, Local not specified - PPO | Admitting: "Endocrinology

## 2024-02-03 HISTORY — PX: CARPAL TUNNEL RELEASE: SHX101

## 2024-03-15 LAB — LIPID PANEL
Chol/HDL Ratio: 4.5 ratio — ABNORMAL HIGH (ref 0.0–4.4)
Cholesterol, Total: 217 mg/dL — ABNORMAL HIGH (ref 100–199)
HDL: 48 mg/dL (ref >39)
LDL Chol Calc (NIH): 137 mg/dL — ABNORMAL HIGH (ref 0–99)
Triglycerides: 178 mg/dL — ABNORMAL HIGH (ref 0–149)
VLDL Cholesterol Cal: 32 mg/dL (ref 5–40)

## 2024-03-15 LAB — TSH: TSH: 1.66 u[IU]/mL (ref 0.450–4.500)

## 2024-03-15 LAB — T4, FREE: Free T4: 1.03 ng/dL (ref 0.82–1.77)

## 2024-03-16 ENCOUNTER — Ambulatory Visit: Payer: Federal, State, Local not specified - PPO | Admitting: "Endocrinology

## 2024-03-16 ENCOUNTER — Encounter: Payer: Self-pay | Admitting: "Endocrinology

## 2024-03-16 VITALS — BP 130/62 | HR 68 | Ht 63.0 in | Wt 278.0 lb

## 2024-03-16 DIAGNOSIS — E782 Mixed hyperlipidemia: Secondary | ICD-10-CM | POA: Diagnosis not present

## 2024-03-16 DIAGNOSIS — E063 Autoimmune thyroiditis: Secondary | ICD-10-CM | POA: Diagnosis not present

## 2024-03-16 DIAGNOSIS — E040 Nontoxic diffuse goiter: Secondary | ICD-10-CM | POA: Diagnosis not present

## 2024-03-16 NOTE — Patient Instructions (Signed)

## 2024-03-16 NOTE — Progress Notes (Signed)
 03/16/2024, 4:26 PM                            Endocrinology follow-up note  Yvonne Frey is a 58 y.o.-year-old female patient being seen in follow-up  for hypothyroidism referred by Stacie Dys, MD.   Past Medical History:  Diagnosis Date   Breast cancer [facility])    Hypothyroidism     Past Surgical History:  Procedure Laterality Date   BREAST LUMPECTOMY Left 11/29/2021   CARPAL TUNNEL RELEASE Left 02/03/2024   COLONOSCOPY  08/26/2022    Social History   Socioeconomic History   Marital status: Single    Spouse name: Not on file   Number of children: Not on file   Years of education: Not on file   Highest education level: Not on file  Occupational History   Not on file  Tobacco Use   Smoking status: Never   Smokeless tobacco: Never  Substance and Sexual Activity   Alcohol use: Never   Drug use: Never   Sexual activity: Not on file  Other Topics Concern   Not on file  Social History Narrative   Not on file   Social Drivers of Health   Financial Resource Strain: Not on file  Food Insecurity: No Food Insecurity (08/01/2019)   Received from [facility], [facility]   Hunger Vital Sign    Worried About Running Out of Food in the Last Year: Never true    Ran Out of Food in the Last Year: Never true  Transportation Needs: No Transportation Needs (08/01/2019)   Received from [facility], [facility]   [facility] - Transportation    Lack of Transportation (Medical): No    Lack of Transportation (Non-Medical): No  Physical Activity: Not on file  Stress: No Stress Concern Present (08/01/2019)   Received from [facility], [facility] of Occupational Health - Occupational Stress Questionnaire    Feeling of Stress : Not at all  Social Connections: Not on file    History reviewed. No pertinent family history.  Outpatient Encounter Medications as of 03/16/2024  Medication Sig    COSENTYX 150 MG/ML SOSY Every 28 days   anastrozole (ARIMIDEX) 1 MG tablet Take 1 mg by mouth daily.   Calcium Carb-Cholecalciferol 600-5 MG-MCG TABS Take 1 tablet by mouth 2 (two) times daily.   clobetasol  (TEMOVATE ) 0.05 % external solution APPLY TO AFFECTED AREAS AS DIRECTED   levothyroxine  (SYNTHROID ) 75 MCG tablet TAKE 1 TABLET BY MOUTH EVERY DAY BEFORE BREAKFAST   lisinopril-hydrochlorothiazide (ZESTORETIC) 10-12.5 MG tablet Take by mouth daily.   omeprazole (PRILOSEC) 40 MG capsule daily as needed.   [DISCONTINUED] STELARA 90 MG/ML SOSY injection Inject 90 mg into the skin. Inject every 90 days   No facility-administered encounter medications on file as of 03/16/2024.    ALLERGIES: Allergies  Allergen Reactions   Sulfa Antibiotics Palpitations   VACCINATION STATUS:  There is no immunization history on file for this patient.   HPI    Yvonne Frey  is a patient with the above medical history. she was diagnosed with hypothyroidism at approximate age of 59 years which required initiation of thyroid  hormone replacement.  She is currently on levothyroxine  75 mcg p.o. daily before breakfast.   She returns for follow-up with repeat thyroid  function test consistent with appropriate replacement.  She returns with significant weight gain.  Her previsit labs also show significant dyslipidemia, patient not on treatment.      Etiology of her hypothyroidism was determined to be Hashimoto's thyroiditis.   - Her previous thyroid  ultrasound documented mild goiter without discrete nodules, denies dysphagia, shortness of breath, but reports occasional voice change.    she reports family history of  thyroid  disorders in her mother who is taking what appears to be thyroid  hormone replacement.  No family history of thyroid  cancer.  No history of  radiation therapy to head or neck.  -She was recently initiated on prednisone 5 mg p.o. daily for psoriatic arthritis.   ROS: Limited as  above.   Physical Exam: BP 130/62   Pulse 68   Ht 5\' 3"  (1.6 m)   Wt 278 lb (126.1 kg)   BMI 49.25 kg/m  Wt Readings from Last 3 Encounters:  03/16/24 278 lb (126.1 kg)  09/17/23 254 lb 3.2 oz (115.3 kg)  03/13/23 281 lb (127.5 kg)   Recent Results (from the past 2160 hours)  Lipid panel     Status: Abnormal   Collection Time: 03/14/24 10:00 AM  Result Value Ref Range   Cholesterol, Total 217 (H) 100 - 199 mg/dL   Triglycerides 914 (H) 0 - 149 mg/dL   HDL 48 >78 mg/dL   VLDL Cholesterol Cal 32 5 - 40 mg/dL   LDL Chol Calc (NIH) 295 (H) 0 - 99 mg/dL   Chol/HDL Ratio 4.5 (H) 0.0 - 4.4 ratio    Comment:                                   T. Chol/HDL Ratio                                             Men  Women                               1/2 Avg.Risk  3.4    3.3                                   Avg.Risk  5.0    4.4                                2X Avg.Risk  9.6    7.1                                3X Avg.Risk 23.4   11.0   TSH     Status: None   Collection Time: 03/14/24 10:00 AM  Result Value Ref Range   TSH 1.660 0.450 - 4.500 uIU/mL  T4, free     Status: None   Collection Time: 03/14/24 10:00 AM  Result Value Ref Range   Free T4 1.03 0.82 - 1.77 ng/dL    Thyroid  ultrasound from August 16, 2019  No discrete nodules are seen within the thyroid  gland.   IMPRESSION: Borderline enlarged and moderately heterogeneous appearing thyroid  gland without discrete nodule or mass.    Recent Results (from the past 2160 hours)  Lipid panel     Status: Abnormal   Collection Time: 03/14/24 10:00 AM  Result Value Ref Range   Cholesterol, Total 217 (H) 100 - 199 mg/dL   Triglycerides 161 (H) 0 - 149 mg/dL   HDL 48 >09 mg/dL   VLDL Cholesterol Cal 32 5 - 40 mg/dL   LDL Chol Calc (NIH) 604 (H) 0 - 99 mg/dL   Chol/HDL Ratio 4.5 (H) 0.0 - 4.4 ratio    Comment:                                   T. Chol/HDL Ratio                                             Men  Women                                1/2 Avg.Risk  3.4    3.3                                   Avg.Risk  5.0    4.4                                2X Avg.Risk  9.6    7.1                                3X Avg.Risk 23.4   11.0   TSH     Status: None   Collection Time: 03/14/24 10:00 AM  Result Value Ref Range   TSH 1.660 0.450 - 4.500 uIU/mL  T4, free     Status: None   Collection Time: 03/14/24 10:00 AM  Result Value Ref Range   Free T4 1.03 0.82 - 1.77 ng/dL     ASSESSMENT: 1. 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  Simple goiter 3.  High BMI of 49.78 4.  Hyperlipidemia  PLAN:   -Her previsit thyroid  function tests are consistent with appropriate replacement.  She is advised to continue levothyroxine  75 mcg p.o. daily before breakfast.    - We discussed about the correct intake of her thyroid  hormone, on empty stomach at fasting, with water, separated by at least 30 minutes from breakfast and other medications,  and separated by more than 4 hours from calcium, iron, multivitamins, acid reflux medications (PPIs). -Patient is made aware of the fact that thyroid  hormone replacement is needed for life, dose to be adjusted by periodic monitoring of thyroid  function tests.  -Prior to her last visit, her thyroid  ultrasound was consistent with simple moderately heterogeneous appearing thyroid  gland without discrete nodules or mass.  She will not need antithyroid intervention at this time.  She will be considered for repeat/surveillance thyroid  ultrasound before her next visit.      In light of her metabolic dysfunction indicated by obesity,  hypertension , significant dyslipidemia, psoriatic arthritis requiring steroid management, and the fact that she would like to avoid therapeutic intervention for hyperlipidemia, she was advised on lifestyle medicine including whole food plant-based diet.   After she previously engaged, in recent weeks she has disengaged.  She wishes to give it a try again  before she considers medications.   She is advised to maintain close follow-up with her PMD.   I spent  26  minutes in the care of the patient today including review of labs from Thyroid  Function, CMP, and other relevant labs ; imaging/biopsy records (current and previous including abstractions from other facilities); face-to-face time discussing  her lab results and symptoms, medications doses, her options of short and long term treatment based on the latest standards of care / guidelines;   and documenting the encounter.  Lyndol Santee  participated in the discussions, expressed understanding, and voiced agreement with the above plans.  All questions were answered to her satisfaction. she is encouraged to contact clinic should she have any questions or concerns prior to her return visit.    Return in about 3 months (around 06/16/2024) for Fasting Labs  in AM B4 8, A1c -NV, Thyroid  / Neck Ultrasound.  Kalvin Orf, MD [facility] [address] [place], [place] [address] Phone: [phone]  Fax: [phone]   03/16/2024, 4:26 PM  This note was partially dictated with voice recognition software. Similar sounding words can be transcribed inadequately or may not  be corrected upon review.

## 2024-04-01 ENCOUNTER — Ambulatory Visit (HOSPITAL_COMMUNITY)
Admission: RE | Admit: 2024-04-01 | Discharge: 2024-04-01 | Disposition: A | Discharge status: Home / Self Care | Source: Ambulatory Visit | Attending: "Endocrinology | Admitting: "Endocrinology

## 2024-04-01 DIAGNOSIS — E040 Nontoxic diffuse goiter: Secondary | ICD-10-CM | POA: Diagnosis present

## 2024-04-17 ENCOUNTER — Other Ambulatory Visit: Payer: Self-pay | Admitting: "Endocrinology

## 2024-06-22 ENCOUNTER — Ambulatory Visit: Admitting: "Endocrinology

## 2024-07-16 LAB — LIPID PANEL
Chol/HDL Ratio: 4.3 ratio (ref 0.0–4.4)
Cholesterol, Total: 190 mg/dL (ref 100–199)
HDL: 44 mg/dL (ref >39)
LDL Chol Calc (NIH): 120 mg/dL — ABNORMAL HIGH (ref 0–99)
Triglycerides: 143 mg/dL (ref 0–149)
VLDL Cholesterol Cal: 26 mg/dL (ref 5–40)

## 2024-07-16 LAB — COMPREHENSIVE METABOLIC PANEL WITH GFR
ALT: 48 IU/L — ABNORMAL HIGH (ref 0–32)
AST: 28 IU/L (ref 0–40)
Albumin: 3.9 g/dL (ref 3.8–4.9)
Alkaline Phosphatase: 108 IU/L (ref 49–135)
BUN/Creatinine Ratio: 23 (ref 9–23)
BUN: 16 mg/dL (ref 6–24)
Bilirubin Total: 0.3 mg/dL (ref 0.0–1.2)
CO2: 23 mmol/L (ref 20–29)
Calcium: 9.2 mg/dL (ref 8.7–10.2)
Chloride: 100 mmol/L (ref 96–106)
Creatinine, Ser: 0.71 mg/dL (ref 0.57–1.00)
Globulin, Total: 2.7 g/dL (ref 1.5–4.5)
Glucose: 141 mg/dL — ABNORMAL HIGH (ref 70–99)
Potassium: 4.4 mmol/L (ref 3.5–5.2)
Sodium: 139 mmol/L (ref 134–144)
Total Protein: 6.6 g/dL (ref 6.0–8.5)
eGFR: 99 mL/min/1.73 (ref >59)

## 2024-07-16 LAB — TSH: TSH: 0.75 u[IU]/mL (ref 0.450–4.500)

## 2024-07-16 LAB — T4, FREE: Free T4: 1.34 ng/dL (ref 0.82–1.77)

## 2024-07-20 ENCOUNTER — Ambulatory Visit: Admitting: "Endocrinology

## 2024-07-25 ENCOUNTER — Encounter: Payer: Self-pay | Admitting: "Endocrinology

## 2024-07-25 ENCOUNTER — Ambulatory Visit: Admitting: "Endocrinology

## 2024-07-25 VITALS — BP 128/56 | HR 64 | Ht 63.0 in | Wt 283.0 lb

## 2024-07-25 DIAGNOSIS — E063 Autoimmune thyroiditis: Secondary | ICD-10-CM

## 2024-07-25 DIAGNOSIS — E782 Mixed hyperlipidemia: Secondary | ICD-10-CM | POA: Diagnosis not present

## 2024-07-25 DIAGNOSIS — E040 Nontoxic diffuse goiter: Secondary | ICD-10-CM

## 2024-07-25 DIAGNOSIS — R7303 Prediabetes: Secondary | ICD-10-CM | POA: Diagnosis not present

## 2024-07-25 LAB — POCT GLYCOSYLATED HEMOGLOBIN (HGB A1C): HbA1c, POC (prediabetic range): 5.8 % (ref 5.7–6.4)

## 2024-07-25 NOTE — Progress Notes (Signed)
 07/25/2024, 4:32 PM                            Endocrinology follow-up note  Yvonne Frey is a 58 y.o.-year-old female patient being seen in follow-up  for hypothyroidism referred by Marian Pat, MD.   Past Medical History:  Diagnosis Date   Breast cancer [facility])    Hypothyroidism     Past Surgical History:  Procedure Laterality Date   BREAST LUMPECTOMY Left 11/29/2021   CARPAL TUNNEL RELEASE Left 02/03/2024   COLONOSCOPY  08/26/2022    Social History   Socioeconomic History   Marital status: Single    Spouse name: Not on file   Number of children: Not on file   Years of education: Not on file   Highest education level: Not on file  Occupational History   Not on file  Tobacco Use   Smoking status: Never   Smokeless tobacco: Never  Substance and Sexual Activity   Alcohol use: Never   Drug use: Never   Sexual activity: Not on file  Other Topics Concern   Not on file  Social History Narrative   Not on file   Social Drivers of Health   Financial Resource Strain: Not on file  Food Insecurity: Low Risk  (04/21/2024)   Received from [facility]   Hunger Vital Sign    Within the past 12 months, you worried that your food would run out before you got money to buy more: Never true    Within the past 12 months, the food you bought just didn't last and you didn't have money to get more. : Never true  Transportation Needs: No Transportation Needs (04/21/2024)   Received from [facility]   Transportation    In the past 12 months, has lack of reliable transportation kept you from medical appointments, meetings, work or from getting things needed for daily living? : No  Physical Activity: Not on file  Stress: No Stress Concern Present (08/01/2019)   Received from [facility] - Occupational Stress Questionnaire    Feeling of Stress : Not at all  Social Connections: Not on  file    History reviewed. No pertinent family history.  Outpatient Encounter Medications as of 07/25/2024  Medication Sig   anastrozole (ARIMIDEX) 1 MG tablet Take 1 mg by mouth daily.   Calcium Carb-Cholecalciferol 600-5 MG-MCG TABS Take 1 tablet by mouth 2 (two) times daily.   clobetasol  (TEMOVATE ) 0.05 % external solution APPLY TO AFFECTED AREAS AS DIRECTED   COSENTYX 150 MG/ML SOSY Every 28 days   levothyroxine  (SYNTHROID ) 75 MCG tablet TAKE 1 TABLET BY MOUTH EVERY DAY BEFORE BREAKFAST   lisinopril-hydrochlorothiazide (ZESTORETIC) 10-12.5 MG tablet Take by mouth daily.   omeprazole (PRILOSEC) 40 MG capsule daily as needed.   No facility-administered encounter medications on file as of 07/25/2024.    ALLERGIES: Allergies  Allergen Reactions   Sulfa Antibiotics Palpitations   VACCINATION STATUS:  There is no immunization history on file for this patient.   HPI    Yvonne Frey  is a patient with the above medical history. she was diagnosed with hypothyroidism at approximate age of 63 years which  required initiation of thyroid  hormone replacement.  She is currently on levothyroxine  75 mcg p.o. daily before breakfast.   She returns for follow-up with repeat thyroid  function test consistent with appropriate replacement.  She returns with significant weight gain.  Her previsit labs also show significant dyslipidemia, patient not on treatment.  Her point-of-care A1c is 5.8% consistent with prediabetes.    Etiology of her hypothyroidism was determined to be Hashimoto's thyroiditis.   - Her previous thyroid  ultrasound documented mild goiter without discrete nodules, denies dysphagia, shortness of breath, but reports occasional voice change.    she reports family history of  thyroid  disorders in her mother who is taking what appears to be thyroid  hormone replacement.  No family history of thyroid  cancer.  No history of  radiation therapy to head or neck.   ROS: Limited as  above.   Physical Exam: BP (!) 128/56   Pulse 64   Ht 5' 3 (1.6 m)   Wt 283 lb (128.4 kg)   BMI 50.13 kg/m  Wt Readings from Last 3 Encounters:  07/25/24 283 lb (128.4 kg)  03/16/24 278 lb (126.1 kg)  09/17/23 254 lb 3.2 oz (115.3 kg)   Recent Results (from the past 2160 hours)  Lipid panel     Status: Abnormal   Collection Time: 07/15/24 10:56 AM  Result Value Ref Range   Cholesterol, Total 190 100 - 199 mg/dL   Triglycerides 856 0 - 149 mg/dL   HDL 44 >60 mg/dL   VLDL Cholesterol Cal 26 5 - 40 mg/dL   LDL Chol Calc (NIH) 879 (H) 0 - 99 mg/dL   Chol/HDL Ratio 4.3 0.0 - 4.4 ratio    Comment:                                   T. Chol/HDL Ratio                                             Men  Women                               1/2 Avg.Risk  3.4    3.3                                   Avg.Risk  5.0    4.4                                2X Avg.Risk  9.6    7.1                                3X Avg.Risk 23.4   11.0   Comprehensive metabolic panel with GFR     Status: Abnormal   Collection Time: 07/15/24 10:56 AM  Result Value Ref Range   Glucose 141 (H) 70 - 99 mg/dL   BUN 16 6 - 24 mg/dL   Creatinine, Ser 9.28 0.57 - 1.00 mg/dL   eGFR 99 >40 fO/fpw/8.26   BUN/Creatinine Ratio 23 9 - 23   Sodium 139 134 -  144 mmol/L   Potassium 4.4 3.5 - 5.2 mmol/L   Chloride 100 96 - 106 mmol/L   CO2 23 20 - 29 mmol/L   Calcium 9.2 8.7 - 10.2 mg/dL   Total Protein 6.6 6.0 - 8.5 g/dL   Albumin 3.9 3.8 - 4.9 g/dL   Globulin, Total 2.7 1.5 - 4.5 g/dL   Bilirubin Total 0.3 0.0 - 1.2 mg/dL   Alkaline Phosphatase 108 49 - 135 IU/L    Comment:               **Please note reference interval change**   AST 28 0 - 40 IU/L   ALT 48 (H) 0 - 32 IU/L  TSH     Status: None   Collection Time: 07/15/24 10:56 AM  Result Value Ref Range   TSH 0.750 0.450 - 4.500 uIU/mL  T4, free     Status: None   Collection Time: 07/15/24 10:56 AM  Result Value Ref Range   Free T4 1.34 0.82 - 1.77 ng/dL  HgB  J8r     Status: None   Collection Time: 07/25/24  4:17 PM  Result Value Ref Range   Hemoglobin A1C     HbA1c POC (<> result, manual entry)     HbA1c, POC (prediabetic range) 5.8 5.7 - 6.4 %   HbA1c, POC (controlled diabetic range)      Thyroid  ultrasound from August 16, 2019 No discrete nodules are seen within the thyroid  gland.   IMPRESSION: Borderline enlarged and moderately heterogeneous appearing thyroid  gland without discrete nodule or mass.    Recent Results (from the past 2160 hours)  Lipid panel     Status: Abnormal   Collection Time: 07/15/24 10:56 AM  Result Value Ref Range   Cholesterol, Total 190 100 - 199 mg/dL   Triglycerides 856 0 - 149 mg/dL   HDL 44 >60 mg/dL   VLDL Cholesterol Cal 26 5 - 40 mg/dL   LDL Chol Calc (NIH) 879 (H) 0 - 99 mg/dL   Chol/HDL Ratio 4.3 0.0 - 4.4 ratio    Comment:                                   T. Chol/HDL Ratio                                             Men  Women                               1/2 Avg.Risk  3.4    3.3                                   Avg.Risk  5.0    4.4                                2X Avg.Risk  9.6    7.1                                3X Avg.Risk 23.4   11.0   Comprehensive  metabolic panel with GFR     Status: Abnormal   Collection Time: 07/15/24 10:56 AM  Result Value Ref Range   Glucose 141 (H) 70 - 99 mg/dL   BUN 16 6 - 24 mg/dL   Creatinine, Ser 9.28 0.57 - 1.00 mg/dL   eGFR 99 >40 fO/fpw/8.26   BUN/Creatinine Ratio 23 9 - 23   Sodium 139 134 - 144 mmol/L   Potassium 4.4 3.5 - 5.2 mmol/L   Chloride 100 96 - 106 mmol/L   CO2 23 20 - 29 mmol/L   Calcium 9.2 8.7 - 10.2 mg/dL   Total Protein 6.6 6.0 - 8.5 g/dL   Albumin 3.9 3.8 - 4.9 g/dL   Globulin, Total 2.7 1.5 - 4.5 g/dL   Bilirubin Total 0.3 0.0 - 1.2 mg/dL   Alkaline Phosphatase 108 49 - 135 IU/L    Comment:               **Please note reference interval change**   AST 28 0 - 40 IU/L   ALT 48 (H) 0 - 32 IU/L  TSH     Status: None    Collection Time: 07/15/24 10:56 AM  Result Value Ref Range   TSH 0.750 0.450 - 4.500 uIU/mL  T4, free     Status: None   Collection Time: 07/15/24 10:56 AM  Result Value Ref Range   Free T4 1.34 0.82 - 1.77 ng/dL  HgB J8r     Status: None   Collection Time: 07/25/24  4:17 PM  Result Value Ref Range   Hemoglobin A1C     HbA1c POC (<> result, manual entry)     HbA1c, POC (prediabetic range) 5.8 5.7 - 6.4 %   HbA1c, POC (controlled diabetic range)       ASSESSMENT: 1. 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  Simple goiter 3.  Prediabetes 4.  High BMI of 50.13 5.  Hyperlipidemia  PLAN:   -Her previsit thyroid  function tests are consistent with appropriate replacement.  She is advised to continue levothyroxine  75 mcg p.o. daily before breakfast.    - We discussed about the correct intake of her thyroid  hormone, on empty stomach at fasting, with water, separated by at least 30 minutes from breakfast and other medications,  and separated by more than 4 hours from calcium, iron, multivitamins, acid reflux medications (PPIs). -Patient is made aware of the fact that thyroid  hormone replacement is needed for life, dose to be adjusted by periodic monitoring of thyroid  function tests.   - On April 01, 2024 her thyroid  ultrasound was consistent with simple moderately heterogeneous appearing thyroid  gland without discrete nodules or mass.  She will not need antithyroid intervention at this time.    In light of her metabolic dysfunction indicated by obesity, prediabetes, hyperlipidemia hypertension , psoriatic arthritis requiring intermittent steroid management, and the fact that she would like to avoid therapeutic intervention for hyperlipidemia, she was advised on lifestyle medicine including whole food plant-based diet.  Her engagement is suboptimal.  She was given options of GLP-1 receptor agonist, hesitant to consider at this time.  She would like to avoid statin intervention , she   wishes to give it a try again before she considers medications. I discussed [employer] program for her to obtain Zepbound, sh ewill like to  research about it and call back when she is ready.   She is advised to maintain close follow-up with her PMD.   I spent  26  minutes in the  care of the patient today including review of labs from Thyroid  Function, CMP, and other relevant labs ; imaging/biopsy records (current and previous including abstractions from other facilities); face-to-face time discussing  her lab results and symptoms, medications doses, her options of short and long term treatment based on the latest standards of care / guidelines;   and documenting the encounter.  Yvonne Frey  participated in the discussions, expressed understanding, and voiced agreement with the above plans.  All questions were answered to her satisfaction. she is encouraged to contact clinic should she have any questions or concerns prior to her return visit.    Return in about 6 months (around 01/22/2025) for Fasting Labs  in AM B4 8, A1c -[place].  Yvonne Earl, MD [facility] [address] [place], [place] [address] Phone: [phone]  Fax: [phone]   07/25/2024, 4:32 PM  This note was partially dictated with voice recognition software. Similar sounding words can be transcribed inadequately or may not  be corrected upon review.

## 2024-07-25 NOTE — Patient Instructions (Signed)

## 2024-10-16 ENCOUNTER — Other Ambulatory Visit: Payer: Self-pay | Admitting: "Endocrinology

## 2025-01-23 ENCOUNTER — Ambulatory Visit: Admitting: "Endocrinology
